# Patient Record
Sex: Male | Born: 2000 | Race: Black or African American | Hispanic: No | Marital: Single | Smoking: Former smoker
Health system: Southern US, Community
[De-identification: ages and names within clinical notes are randomized; demographics above are authoritative.]

## PROBLEM LIST (undated history)

## (undated) ENCOUNTER — Ambulatory Visit: Admission: EM | Source: Home / Self Care

## (undated) DIAGNOSIS — K219 Gastro-esophageal reflux disease without esophagitis: Secondary | ICD-10-CM

## (undated) DIAGNOSIS — T7840XA Allergy, unspecified, initial encounter: Secondary | ICD-10-CM

## (undated) DIAGNOSIS — D571 Sickle-cell disease without crisis: Secondary | ICD-10-CM

## (undated) DIAGNOSIS — F419 Anxiety disorder, unspecified: Secondary | ICD-10-CM

## (undated) HISTORY — PX: TONSILLECTOMY: SUR1361

## (undated) HISTORY — DX: Allergy, unspecified, initial encounter: T78.40XA

## (undated) HISTORY — DX: Gastro-esophageal reflux disease without esophagitis: K21.9

## (undated) HISTORY — DX: Anxiety disorder, unspecified: F41.9

---
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
## 2021-01-12 ENCOUNTER — Emergency Department
Admission: EM | Admit: 2021-01-12 | Discharge: 2021-01-12 | Disposition: A | Discharge status: Home / Self Care | Payer: BC Managed Care – PPO | Attending: Emergency Medicine | Admitting: Emergency Medicine

## 2021-01-12 ENCOUNTER — Other Ambulatory Visit: Payer: Self-pay

## 2021-01-12 ENCOUNTER — Emergency Department: Payer: BC Managed Care – PPO

## 2021-01-12 DIAGNOSIS — J189 Pneumonia, unspecified organism: Secondary | ICD-10-CM

## 2021-01-12 DIAGNOSIS — J181 Lobar pneumonia, unspecified organism: Secondary | ICD-10-CM | POA: Diagnosis not present

## 2021-01-12 DIAGNOSIS — R079 Chest pain, unspecified: Secondary | ICD-10-CM

## 2021-01-12 DIAGNOSIS — R0789 Other chest pain: Secondary | ICD-10-CM | POA: Diagnosis present

## 2021-01-12 HISTORY — DX: Sickle-cell disease without crisis: D57.1

## 2021-01-12 LAB — RETICULOCYTES
Immature Retic Fract: 40.1 % — ABNORMAL HIGH (ref 2.3–15.9)
RBC.: 4.41 MIL/uL (ref 4.22–5.81)
Retic Count, Absolute: 209 10*3/uL — ABNORMAL HIGH (ref 19.0–186.0)
Retic Ct Pct: 4.7 % — ABNORMAL HIGH (ref 0.4–3.1)

## 2021-01-12 LAB — CBC WITH DIFFERENTIAL/PLATELET
Abs Immature Granulocytes: 0.05 10*3/uL (ref 0.00–0.07)
Basophils Absolute: 0.1 10*3/uL (ref 0.0–0.1)
Basophils Relative: 1 %
Eosinophils Absolute: 0.2 10*3/uL (ref 0.0–0.5)
Eosinophils Relative: 1 %
HCT: 33.9 % — ABNORMAL LOW (ref 39.0–52.0)
Hemoglobin: 12.1 g/dL — ABNORMAL LOW (ref 13.0–17.0)
Immature Granulocytes: 0 %
Lymphocytes Relative: 41 %
Lymphs Abs: 6.4 10*3/uL — ABNORMAL HIGH (ref 0.7–4.0)
MCH: 27.2 pg (ref 26.0–34.0)
MCHC: 35.7 g/dL (ref 30.0–36.0)
MCV: 76.2 fL — ABNORMAL LOW (ref 80.0–100.0)
Monocytes Absolute: 1.4 10*3/uL — ABNORMAL HIGH (ref 0.1–1.0)
Monocytes Relative: 9 %
Neutro Abs: 7.5 10*3/uL (ref 1.7–7.7)
Neutrophils Relative %: 48 %
Platelets: 399 10*3/uL (ref 150–400)
RBC: 4.45 MIL/uL (ref 4.22–5.81)
RDW: 17.5 % — ABNORMAL HIGH (ref 11.5–15.5)
WBC: 15.6 10*3/uL — ABNORMAL HIGH (ref 4.0–10.5)
nRBC: 0.8 % — ABNORMAL HIGH (ref 0.0–0.2)

## 2021-01-12 LAB — TSH: TSH: 1.012 u[IU]/mL (ref 0.350–4.500)

## 2021-01-12 LAB — BASIC METABOLIC PANEL
Anion gap: 12 (ref 5–15)
BUN: 8 mg/dL (ref 6–20)
CO2: 21 mmol/L — ABNORMAL LOW (ref 22–32)
Calcium: 9.4 mg/dL (ref 8.9–10.3)
Chloride: 109 mmol/L (ref 98–111)
Creatinine, Ser: 1.17 mg/dL (ref 0.61–1.24)
GFR, Estimated: >60 mL/min (ref >60)
Glucose, Bld: 163 mg/dL — ABNORMAL HIGH (ref 70–99)
Potassium: 3.7 mmol/L (ref 3.5–5.1)
Sodium: 142 mmol/L (ref 135–145)

## 2021-01-12 LAB — LACTIC ACID, PLASMA: Lactic Acid, Venous: 1.5 mmol/L (ref 0.5–1.9)

## 2021-01-12 LAB — TROPONIN I (HIGH SENSITIVITY)
Troponin I (High Sensitivity): 11 ng/L (ref <18)
Troponin I (High Sensitivity): 11 ng/L (ref <18)

## 2021-01-12 LAB — T4, FREE: Free T4: 0.78 ng/dL (ref 0.61–1.12)

## 2021-01-12 LAB — D-DIMER, QUANTITATIVE: D-Dimer, Quant: 0.57 ug/mL-FEU — ABNORMAL HIGH (ref 0.00–0.50)

## 2021-01-12 LAB — CK: Total CK: 150 U/L (ref 49–397)

## 2021-01-12 MED ORDER — SODIUM CHLORIDE 0.9 % IV BOLUS
1000.0000 mL | Freq: Once | INTRAVENOUS | Status: AC
Start: 2021-01-12 — End: 2021-01-12
  Administered 2021-01-12: 1000 mL via INTRAVENOUS

## 2021-01-12 MED ORDER — KETOROLAC TROMETHAMINE 30 MG/ML IJ SOLN
15.0000 mg | Freq: Once | INTRAMUSCULAR | Status: AC
Start: 2021-01-12 — End: 2021-01-12
  Administered 2021-01-12: 15 mg via INTRAVENOUS
  Filled 2021-01-12: qty 1

## 2021-01-12 MED ORDER — IOHEXOL 350 MG/ML SOLN
75.0000 mL | Freq: Once | INTRAVENOUS | Status: AC | PRN
  Administered 2021-01-12: 75 mL via INTRAVENOUS

## 2021-01-12 MED ORDER — SODIUM CHLORIDE 0.9 % IV SOLN
500.0000 mg | Freq: Once | INTRAVENOUS | Status: AC
  Administered 2021-01-12: 500 mg via INTRAVENOUS
  Filled 2021-01-12: qty 500

## 2021-01-12 MED ORDER — AZITHROMYCIN 250 MG PO TABS: 250.0000 mg | ORAL_TABLET | Freq: Every day | ORAL | 0 refills | Status: DC

## 2021-01-12 MED ORDER — ALBUTEROL SULFATE (2.5 MG/3ML) 0.083% IN NEBU
2.5000 mg | INHALATION_SOLUTION | Freq: Once | RESPIRATORY_TRACT | Status: AC
  Administered 2021-01-12: 2.5 mg via RESPIRATORY_TRACT
  Filled 2021-01-12: qty 3

## 2021-01-12 MED ORDER — AMOXICILLIN-POT CLAVULANATE 875-125 MG PO TABS: 1.0000 | ORAL_TABLET | Freq: Two times a day (BID) | ORAL | 0 refills | Status: DC

## 2021-01-12 MED ORDER — ONDANSETRON HCL 4 MG/2ML IJ SOLN
4.0000 mg | Freq: Once | INTRAMUSCULAR | Status: AC
  Administered 2021-01-12: 4 mg via INTRAVENOUS
  Filled 2021-01-12: qty 2

## 2021-01-12 MED ORDER — SODIUM CHLORIDE 0.9 % IV SOLN
1.0000 g | INTRAVENOUS | Status: DC
  Administered 2021-01-12: 1 g via INTRAVENOUS
  Filled 2021-01-12: qty 10

## 2021-01-12 NOTE — ED Provider Notes (Signed)
[facility] Emergency Department Provider Note   ____________________________________________   Event Date/Time   First MD Initiated Contact with Patient 01/12/21 [phone]     (approximate)  I have reviewed the triage vital signs and the nursing notes.   HISTORY  Chief Complaint Chest Pain    HPI William Austin is a 20 y.o. male who presents to the ED from home with a chief complaint of chest tightness.  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  States he was drifting off to sleep approximately 1 hour prior to arrival when he started to have a sensation of chest tightness and tingly feeling up to his head.  Denies associated diaphoresis, shortness of breath, palpitations, nausea/vomiting.  Denies recent fever, cough, abdominal pain, dysuria or diarrhea.  Denies recent travel or trauma.  Denies COVID exposure.  Does admit to recent marijuana use.     Past Medical History:  Diagnosis Date  . Sickle cell anemia (HCC)   Acute chest syndrome 05/2011  There are no problems to display for this patient.   Past Surgical History:  Procedure Laterality Date  . TONSILLECTOMY      Prior to Admission medications   Not on File    Allergies Patient has no allergy information on record.  No family history on file.  Social History Social History   Tobacco Use  . Smoking status: Never Smoker  . Smokeless tobacco: Never Used  Substance Use Topics  . Alcohol use: Yes    Review of Systems  Constitutional: No fever/chills Eyes: No visual changes. ENT: No sore throat. Cardiovascular: Positive for chest pain. Respiratory: Denies shortness of breath. Gastrointestinal: No abdominal pain.  No nausea, no vomiting.  No diarrhea.  No constipation. Genitourinary: Negative for  dysuria. Musculoskeletal: Negative for back pain. Skin: Negative for rash. Neurological: Negative for headaches, focal weakness or numbness.   ____________________________________________   PHYSICAL EXAM:  VITAL SIGNS: ED Triage Vitals  Enc Vitals Group     BP 01/12/21 0231 (!) 179/100     Pulse Rate 01/12/21 0231 (!) 119     Resp 01/12/21 0231 20     Temp 01/12/21 0231 98.4 F (36.9 C)     Temp Source 01/12/21 0231 Oral     SpO2 01/12/21 0231 100 %     Weight 01/12/21 0229 150 lb (68 kg)     Height 01/12/21 0229 5\' 6"  (1.676 m)     Head Circumference --      Peak Flow --      Pain Score 01/12/21 0229 5     Pain Loc --      Pain Edu? --      Excl. in GC? --     Constitutional: Alert and oriented. Well appearing and in no acute distress. Eyes: Conjunctivae are normal. PERRL. EOMI. Head: Atraumatic. Nose: No congestion/rhinnorhea. Mouth/Throat: Mucous membranes are moist.   Neck: No stridor.   Cardiovascular: Normal rate, regular rhythm. Grossly normal heart sounds.  Good peripheral circulation. Respiratory: Normal respiratory effort.  No retractions. Lungs slightly diminished bibasilarly. Gastrointestinal: Soft and nontender to light or deep palpation. No distention. No abdominal bruits. No CVA tenderness. Musculoskeletal: No lower extremity tenderness nor edema.  No joint effusions. Neurologic:  Normal speech and language. No gross focal neurologic deficits are appreciated. No gait instability. Skin:  Skin is warm, dry and intact. No rash noted. Psychiatric: Mood and affect are normal. Speech and behavior are normal.  ____________________________________________   LABS (all labs ordered are listed, but only abnormal results are displayed)  Labs Reviewed  CBC WITH DIFFERENTIAL/PLATELET - Abnormal; Notable for the following components:      Result Value   WBC 15.6 (*)    Hemoglobin 12.1 (*)    HCT 33.9 (*)    MCV 76.2 (*)    RDW 17.5 (*)    nRBC 0.8 (*)     Lymphs Abs 6.4 (*)    Monocytes Absolute 1.4 (*)    All other components within normal limits  BASIC METABOLIC PANEL - Abnormal; Notable for the following components:   CO2 21 (*)    Glucose, Bld 163 (*)    All other components within normal limits  RETICULOCYTES - Abnormal; Notable for the following components:   Retic Ct Pct 4.7 (*)    Retic Count, Absolute 209.0 (*)    Immature Retic Fract 40.1 (*)    All other components within normal limits  D-DIMER, QUANTITATIVE - Abnormal; Notable for the following components:   D-Dimer, Quant 0.57 (*)    All other components within normal limits  CULTURE, BLOOD (ROUTINE X 2)  CULTURE, BLOOD (ROUTINE X 2)  TSH  T4, FREE  CK  URINE DRUG SCREEN, QUALITATIVE ([facility] ONLY)  LACTIC ACID, PLASMA  LACTIC ACID, PLASMA  PROCALCITONIN  TROPONIN I (HIGH SENSITIVITY)  TROPONIN I (HIGH SENSITIVITY)   ____________________________________________  EKG  ED ECG REPORT I, Montrey Buist J, the attending physician, personally viewed and interpreted this ECG.   Date: 01/12/2021  EKG Time: 0229  Rate: 121  Rhythm: sinus tachycardia  Axis: Normal  Intervals:none  ST&T Change: Nonspecific  ____________________________________________  RADIOLOGY I, Jehan Bonano J, personally viewed and evaluated these images (plain radiographs) as part of my medical decision making, as well as reviewing the written report by the radiologist.  ED MD interpretation: No acute cardiopulmonary process; CT chest negative for PE but concerning for multifocal pneumonia versus ACS  Official radiology report(s): DG Chest 2 View  Result Date: 01/12/2021 CLINICAL DATA:  Chest pain, tightness and tingling, history of sickle cell EXAM: CHEST - 2 VIEW COMPARISON:  Radiograph 08/13/2011 FINDINGS: No consolidation, features of edema, pneumothorax, or effusion. Pulmonary vascularity is normally distributed. The cardiomediastinal contours are unremarkable. No acute osseous or soft tissue  abnormality. IMPRESSION: No acute cardiopulmonary abnormality. Electronically Signed   By: Kreg ShropshirePrice  DeHay M.D.   On: 01/12/2021 03:11   CT Angio Chest PE W/Cm &/Or Wo Cm  Result Date: 01/12/2021 CLINICAL DATA:  20 year old male with acute onset chest tightness. Abnormal D-dimer. Sickle cell anemia. EXAM: CT ANGIOGRAPHY CHEST WITH CONTRAST TECHNIQUE: Multidetector CT imaging of the chest was performed using the standard protocol during bolus administration of intravenous contrast. Multiplanar CT image reconstructions and MIPs were obtained to evaluate the vascular anatomy. CONTRAST:  75mL OMNIPAQUE IOHEXOL 350 MG/ML SOLN COMPARISON:  Chest radiographs 0251 hours. FINDINGS: Cardiovascular: Adequate contrast bolus timing in the pulmonary arterial tree. Mild respiratory motion. No focal filling defect identified in the pulmonary arteries to suggest acute pulmonary embolism. Borderline cardiomegaly. No pericardial effusion. Normal visible aorta. No calcified coronary artery atherosclerosis is evident. Mediastinum/Nodes: Small volume residual thymus. No mediastinal or hilar lymphadenopathy. Lungs/Pleura: Somewhat low lung volumes. Major airways remain patent. Mild dependent atelectasis. Superimposed multifocal right upper lobe nodular peribronchial sub solid opacity (series 7, image 36). Superimposed 2 cm area  of peripheral consolidation in the lingula (series 7, image 54), smaller similar peripheral opacity in the posterior basal segment left lower lobe. Right middle and lower lobe appear spared, with only mild atelectasis. No pleural effusion. Upper Abdomen: Stomach is distended in the left upper quadrant. Negative visible liver and spleen. Musculoskeletal: Abnormal bone mineralization, likely the sequelae of sickle cell disease. No vertebral endplate collapse. No acute osseous abnormality identified. Review of the MIP images confirms the above findings. IMPRESSION: 1. Negative for acute pulmonary embolus. 2.  Appearance suspicious for acute chest syndrome versus multifocal infection: Right upper lobe bronchopneumonia and small peripheral areas of lung consolidation in the lingula and left lower lobe. Superimposed pulmonary atelectasis. No pleural effusion. 3. Borderline cardiomegaly. Abnormal bone mineralization, likely related to sickle cell disease. Electronically Signed   By: Odessa Fleming M.D.   On: 01/12/2021 06:25    ____________________________________________   PROCEDURES  Procedure(s) performed (including Critical Care):  .1-3 Lead EKG Interpretation Performed by: Irean Hong, MD Authorized by: Irean Hong, MD     Interpretation: abnormal     ECG rate:  115   ECG rate assessment: tachycardic     Rhythm: sinus tachycardia     Ectopy: none     Conduction: normal   Comments:     Patient placed on cardiac monitor to evaluate for arrhythmias     ____________________________________________   INITIAL IMPRESSION / ASSESSMENT AND PLAN / ED COURSE  As part of my medical decision making, I reviewed the following data within the electronic MEDICAL RECORD NUMBER Nursing notes reviewed and incorporated, Labs reviewed, EKG interpreted, Old chart reviewed, Radiograph reviewed and Notes from prior ED visits     20 year old male with sickle cell [place], history of ACS presenting with chest tightness. Differential diagnosis includes, but is not limited to, sickle cell pain crisis, acute chest syndrome, ACS, aortic dissection, pulmonary embolism, cardiac tamponade, pneumothorax, pneumonia, pericarditis, myocarditis, GI-related causes including esophagitis/gastritis, and musculoskeletal chest wall pain.    Initial troponin unremarkable.  Patient tachycardic.  Will check thyroid function, CK, repeat troponin as well as UDS.  Initiate IV fluid resuscitation, IV Toradol for pain.  Administer albuterol nebulizer and reassess.  Clinical Course as of 01/12/21 0716  Fri Jan 12, 2021  4098 Patient feeling better.   Heart rate normalized.  Updated patient and family member of mildly elevated D-dimer.  We will proceed with CTA chest to evaluate for PE. [JS]  0645 CT chest unremarkable for PE but concerning for infiltrates.  Doubt acute chest syndrome as patient is afebrile, not tachypneic nor hypoxic.  Have paged on-call hematologist for guidance.  In the interim, will obtain blood cultures, lactic acid, procalcitonin and initiate IV antibiotics for community-acquired pneumonia. [JS]  [id number] Left voicemail for on-call hematologist Dr. Orlie Dakin to call back. [JS]  0700 Care transferred to Dr. Erma Heritage at change of shift.  Awaiting callback from hematology, results of lactic acid and procalcitonin.  Have updated patient and his family member.  He currently denies chest pain or shortness of breath.  Room air saturations 99%. [JS]  1191 Spoke with Dr. Orlie Dakin who agrees with plan for discharge home on antibiotics.  I have encouraged the patient to follow-up with [facility] heme/onc clinic.  Patient will be discharged home after completion of IV antibiotics. [JS]    Clinical Course User Index [JS] Irean Hong, MD     ____________________________________________   FINAL CLINICAL IMPRESSION(S) / ED DIAGNOSES  Final diagnoses:  Nonspecific chest pain  Community acquired pneumonia of right upper lobe of lung     ED Discharge Orders    None      *Please note:  William Austin was evaluated in Emergency Department on 01/12/2021 for the symptoms described in the history of present illness. He was evaluated in the context of the global COVID-19 pandemic, which necessitated consideration that the patient might be at risk for infection with the SARS-CoV-2 virus that causes COVID-19. Institutional protocols and algorithms that pertain to the evaluation of patients at risk for COVID-19 are in a state of rapid change based on information released by regulatory bodies including the CDC and federal and state organizations. These  policies and algorithms were followed during the patient's care in the ED.  Some ED evaluations and interventions may be delayed as a result of limited staffing during and the pandemic.*   Note:  This document was prepared using Dragon voice recognition software and may include unintentional dictation errors.   Irean Hong, MD 01/12/21 [phone]

## 2021-01-12 NOTE — ED Notes (Signed)
Pt transported to CT ?

## 2021-01-12 NOTE — ED Notes (Signed)
Patient transported to X-ray 

## 2021-01-12 NOTE — Discharge Instructions (Addendum)
Take antibiotics as prescribed (Augmentin 875mg  twice daily x 7 days, Azithromycin 250mg  daily x 4 days). You may take Tylenol and/or Ibuprofen as needed for discomfort.  Return to the ER for worsening symptoms, persistent vomiting, difficulty breathing or other concerns

## 2021-01-12 NOTE — ED Triage Notes (Addendum)
Pt states starting aprox 1 hour ago he started to have a tightness and tingling feeling to his chest and up to his head. Pt has sickle cell. Pt denies any sob.

## 2021-01-12 NOTE — ED Provider Notes (Signed)
Assumed care from Dr. Dolores Frame at 7 AM. Briefly, the patient is a 20 y.o. male with PMHx of  has a past medical history of Sickle cell anemia (HCC). here with chest pain. Patient awaiting infusion of Rocephin, d/c afterwards. Briefly, pt has HbSC SCA and is here with transient, mild CP. CT shows ? Infiltrates though no fever, cough, SOB, hypoxia, or other signs to suggest acute chest. Case was discussed with HemeOnc by Dr. Dolores Frame. Plan to d/c with oral ABX after receiving meds in ED. LA pending.   Labs Reviewed  CBC WITH DIFFERENTIAL/PLATELET - Abnormal; Notable for the following components:      Result Value   WBC 15.6 (*)    Hemoglobin 12.1 (*)    HCT 33.9 (*)    MCV 76.2 (*)    RDW 17.5 (*)    nRBC 0.8 (*)    Lymphs Abs 6.4 (*)    Monocytes Absolute 1.4 (*)    All other components within normal limits  BASIC METABOLIC PANEL - Abnormal; Notable for the following components:   CO2 21 (*)    Glucose, Bld 163 (*)    All other components within normal limits  RETICULOCYTES - Abnormal; Notable for the following components:   Retic Ct Pct 4.7 (*)    Retic Count, Absolute 209.0 (*)    Immature Retic Fract 40.1 (*)    All other components within normal limits  D-DIMER, QUANTITATIVE - Abnormal; Notable for the following components:   D-Dimer, Quant 0.57 (*)    All other components within normal limits  CULTURE, BLOOD (ROUTINE X 2)  CULTURE, BLOOD (ROUTINE X 2)  TSH  T4, FREE  CK  URINE DRUG SCREEN, QUALITATIVE ([facility] ONLY)  LACTIC ACID, PLASMA  LACTIC ACID, PLASMA  PROCALCITONIN  TROPONIN I (HIGH SENSITIVITY)  TROPONIN I (HIGH SENSITIVITY)    Course of Care: -LA normal. D/c with outpt ABX per Dr. Dolores Frame. Return precautions given. He remains well appearing w/o tachypnea, hypoxia, or ongoing CP/SOB.     Shaune Pollack, MD 01/12/21 [phone]

## 2021-01-17 LAB — CULTURE, BLOOD (ROUTINE X 2)
Culture: NO GROWTH
Culture: NO GROWTH
Special Requests: ADEQUATE
Special Requests: ADEQUATE

## 2021-11-09 ENCOUNTER — Telehealth: Payer: Self-pay

## 2021-11-09 NOTE — Telephone Encounter (Signed)
Appt scheduled

## 2021-11-09 NOTE — Telephone Encounter (Signed)
Copied from CRM #[id number]. Topic: General - Other >> Nov 09, 2021 10:05 AM Jaquita Rector A wrote: Reason for CRM: Patient has [employer] plan and would like a call back for a new patient visit please can be reached at Ph# [phone]

## 2021-11-11 ENCOUNTER — Emergency Department: Payer: Self-pay

## 2021-11-11 ENCOUNTER — Other Ambulatory Visit: Payer: Self-pay

## 2021-11-11 ENCOUNTER — Emergency Department
Admission: EM | Admit: 2021-11-11 | Discharge: 2021-11-11 | Disposition: A | Discharge status: Home / Self Care | Payer: Self-pay | Attending: Emergency Medicine | Admitting: Emergency Medicine

## 2021-11-11 DIAGNOSIS — R079 Chest pain, unspecified: Secondary | ICD-10-CM

## 2021-11-11 DIAGNOSIS — F419 Anxiety disorder, unspecified: Secondary | ICD-10-CM | POA: Insufficient documentation

## 2021-11-11 DIAGNOSIS — R0789 Other chest pain: Secondary | ICD-10-CM | POA: Insufficient documentation

## 2021-11-11 LAB — COMPREHENSIVE METABOLIC PANEL
ALT: 59 U/L — ABNORMAL HIGH (ref 0–44)
AST: 51 U/L — ABNORMAL HIGH (ref 15–41)
Albumin: 5 g/dL (ref 3.5–5.0)
Alkaline Phosphatase: 70 U/L (ref 38–126)
Anion gap: 13 (ref 5–15)
BUN: 8 mg/dL (ref 6–20)
CO2: 22 mmol/L (ref 22–32)
Calcium: 11.4 mg/dL — ABNORMAL HIGH (ref 8.9–10.3)
Chloride: 104 mmol/L (ref 98–111)
Creatinine, Ser: 1.2 mg/dL (ref 0.61–1.24)
GFR, Estimated: >60 mL/min (ref >60)
Glucose, Bld: 104 mg/dL — ABNORMAL HIGH (ref 70–99)
Potassium: 3.9 mmol/L (ref 3.5–5.1)
Sodium: 139 mmol/L (ref 135–145)
Total Bilirubin: 1.8 mg/dL — ABNORMAL HIGH (ref 0.3–1.2)
Total Protein: 8.5 g/dL — ABNORMAL HIGH (ref 6.5–8.1)

## 2021-11-11 LAB — CBC
HCT: 39.6 % (ref 39.0–52.0)
Hemoglobin: 14 g/dL (ref 13.0–17.0)
MCH: 26 pg (ref 26.0–34.0)
MCHC: 35.4 g/dL (ref 30.0–36.0)
MCV: 73.5 fL — ABNORMAL LOW (ref 80.0–100.0)
Platelets: 499 10*3/uL — ABNORMAL HIGH (ref 150–400)
RBC: 5.39 MIL/uL (ref 4.22–5.81)
RDW: 18.8 % — ABNORMAL HIGH (ref 11.5–15.5)
WBC: 13 10*3/uL — ABNORMAL HIGH (ref 4.0–10.5)
nRBC: 0.3 % — ABNORMAL HIGH (ref 0.0–0.2)

## 2021-11-11 LAB — TROPONIN I (HIGH SENSITIVITY)
Troponin I (High Sensitivity): 12 ng/L (ref <18)
Troponin I (High Sensitivity): 10 ng/L (ref <18)

## 2021-11-11 LAB — D-DIMER, QUANTITATIVE: D-Dimer, Quant: 1.59 ug/mL-FEU — ABNORMAL HIGH (ref 0.00–0.50)

## 2021-11-11 LAB — LIPASE, BLOOD: Lipase: 29 U/L (ref 11–51)

## 2021-11-11 MED ORDER — LORAZEPAM 2 MG/ML IJ SOLN
1.0000 mg | Freq: Once | INTRAMUSCULAR | Status: AC
  Administered 2021-11-11: 1 mg via INTRAVENOUS
  Filled 2021-11-11: qty 1

## 2021-11-11 MED ORDER — IOHEXOL 350 MG/ML SOLN
75.0000 mL | Freq: Once | INTRAVENOUS | Status: AC | PRN
Start: 2021-11-11 — End: 2021-11-11
  Administered 2021-11-11: 75 mL via INTRAVENOUS

## 2021-11-11 MED ORDER — PANTOPRAZOLE SODIUM 40 MG PO TBEC: 40.0000 mg | DELAYED_RELEASE_TABLET | Freq: Every day | ORAL | 1 refills | Status: DC

## 2021-11-11 MED ORDER — ALUM & MAG HYDROXIDE-SIMETH 200-200-20 MG/5ML PO SUSP
30.0000 mL | Freq: Once | ORAL | Status: AC
  Administered 2021-11-11: 30 mL via ORAL
  Filled 2021-11-11: qty 30

## 2021-11-11 MED ORDER — LIDOCAINE VISCOUS HCL 2 % MT SOLN
15.0000 mL | Freq: Once | OROMUCOSAL | Status: AC
  Administered 2021-11-11: 15 mL via ORAL
  Filled 2021-11-11: qty 15

## 2021-11-11 NOTE — ED Triage Notes (Signed)
Pt to ED for indigestion for past couple days. Reports has taken tums. Worsening after eat. Also reports anxiety, states unsure if this is worsening.  Nad noted.  Denies n/v/shob.  Recent ETOH use

## 2021-11-11 NOTE — ED Provider Notes (Signed)
[facility] Provider Note    Event Date/Time   First MD Initiated Contact with Patient 11/11/21 1500     (approximate)  History   Chief Complaint: Indigestion  HPI  William Austin is a 21 y.o. male with a past medical history of sickle cell disease presents emergency department for chest discomfort.  According to the patient over the past week or so he has been experiencing discomfort in the center of his chest which he describes as an indigestion feeling.  Patient states he feels like he needs to belch but is not able to do so.  Patient also admits to significant anxiety.  Denies any shortness of breath cough or fever.  Patient states he did drink alcohol on Friday but does not normally drink alcohol and is not a daily drinker.  Physical Exam   Triage Vital Signs: ED Triage Vitals [11/11/21 1453]  Enc Vitals Group     BP (!) 176/131     Pulse Rate (!) 120     Resp 20     Temp 98.4 F (36.9 C)     Temp src      SpO2 99 %     Weight 150 lb (68 kg)     Height 5\' 6"  (1.676 m)     Head Circumference      Peak Flow      Pain Score 0     Pain Loc      Pain Edu?      Excl. in [place]?     Most recent vital signs: Vitals:   11/11/21 1453 11/11/21 1458  BP: (!) 176/131   Pulse: (!) 120 (!) 134  Resp: 20   Temp: 98.4 F (36.9 C)   SpO2: 99%     General: Awake, patient does appear anxious. CV:  Good peripheral perfusion.  Regular rhythm rate around 120 bpm. Resp:  Normal effort.  Equal breath sounds bilaterally.  No wheeze rales or rhonchi. Abd:  No distention.  Soft, nontender.  No rebound or guarding.    ED Results / Procedures / Treatments   EKG  EKG viewed and interpreted by myself shows sinus tachycardia at 136 bpm with a narrow QRS, normal axis, normal intervals.  Patient has nonspecific ST changes with peaked appearing T waves.  RADIOLOGY  I personally reviewed the CT images, no acute abnormality seen on my evaluation. Radiology is read  the CTA is negative.   MEDICATIONS ORDERED IN ED: Medications  LORazepam (ATIVAN) injection 1 mg (has no administration in time range)  alum & mag hydroxide-simeth (MAALOX/MYLANTA) 200-200-20 MG/5ML suspension 30 mL (has no administration in time range)    And  lidocaine (XYLOCAINE) 2 % viscous mouth solution 15 mL (has no administration in time range)     IMPRESSION / MDM / ASSESSMENT AND PLAN / ED COURSE  I reviewed the triage vital signs and the nursing notes.  Patient presents to the emergency department for chest discomfort for the past 1 week described as a indigestion type feeling like he needs to belch.  Patient denies any shortness of breath nausea or diaphoresis.  Patient is quite anxious in appearance and admits a history of anxiety.  Patient has a history of sickle cell disease, differential would include ACS, acute chest/pneumonia, metabolic or electrolyte abnormality given the elevated T waves concern for possible hyperkalemia.  We will check labs including chemistry, cardiac enzymes.  We will treat the patient's anxiety with Ativan and indigestion feeling  with a GI cocktail and reassess.  We will continue to closely monitor while awaiting results.  Patient noted to be quite hypertensive as well as tachycardic but again appears quite anxious as well.  Patients lab work has resulted showing a negative troponin x2.  Reassuring CBC with a reassuring hemoglobin, reassuring chemistry.  Patient's D-dimer however has resulted elevated at 1.59.  CTA of the chest I have reviewed and I do not see any evidence of PE.  Radiology is read the CT is negative for acute abnormality.  Patient states he is feeling better, pulse rate varies between 90 and 115 bpm.  Given the patient's reassuring work-up I believe the patient is safe for discharge home.  Given his intermittent chest discomfort he is experiencing which he describes more as a gas pressure we will prescribe Protonix have the patient follow-up  with his doctor.  I discussed very strict return precautions.  Patient agreeable to plan of care.  Reassuring vitals currently.  Blood pressure 136/99, afebrile.  FINAL CLINICAL IMPRESSION(S) / ED DIAGNOSES   Chest pain  Rx / DC Orders   Protonix  Note:  This document was prepared using Dragon voice recognition software and may include unintentional dictation errors.   Harvest Dark, MD 11/11/21 [phone]

## 2022-01-24 ENCOUNTER — Other Ambulatory Visit: Payer: Self-pay

## 2022-01-24 ENCOUNTER — Encounter: Payer: Self-pay | Admitting: Nurse Practitioner

## 2022-01-24 ENCOUNTER — Ambulatory Visit (INDEPENDENT_AMBULATORY_CARE_PROVIDER_SITE_OTHER): Payer: Self-pay | Admitting: Nurse Practitioner

## 2022-01-24 VITALS — BP 120/72 | HR 100 | Temp 98.8°F | Wt 158.0 lb

## 2022-01-24 DIAGNOSIS — K219 Gastro-esophageal reflux disease without esophagitis: Secondary | ICD-10-CM

## 2022-01-24 DIAGNOSIS — R748 Abnormal levels of other serum enzymes: Secondary | ICD-10-CM

## 2022-01-24 DIAGNOSIS — K4090 Unilateral inguinal hernia, without obstruction or gangrene, not specified as recurrent: Secondary | ICD-10-CM

## 2022-01-24 DIAGNOSIS — Z23 Encounter for immunization: Secondary | ICD-10-CM

## 2022-01-24 DIAGNOSIS — Z09 Encounter for follow-up examination after completed treatment for conditions other than malignant neoplasm: Secondary | ICD-10-CM

## 2022-01-24 DIAGNOSIS — D571 Sickle-cell disease without crisis: Secondary | ICD-10-CM

## 2022-01-24 DIAGNOSIS — Z7689 Persons encountering health services in other specified circumstances: Secondary | ICD-10-CM

## 2022-01-24 MED ORDER — PANTOPRAZOLE SODIUM 40 MG PO TBEC: 40.0000 mg | DELAYED_RELEASE_TABLET | Freq: Every day | ORAL | 3 refills | Status: DC

## 2022-01-24 NOTE — Addendum Note (Signed)
Addended by: Della Goo F on: 01/24/2022 03:14 PM ? ? Modules accepted: Orders ? ?

## 2022-01-24 NOTE — Progress Notes (Signed)
? ?BP 120/72   Pulse 100   Temp 98.8 ?F (37.1 ?C) (Oral)   Wt 158 lb (71.7 kg)   SpO2 98%   BMI 25.50 kg/m?   ? ?Subjective:  ? ? Patient ID: William Austin, male    DOB: 01/26/2001, 21 y.o.   MRN: [id number] ? ?HPI: ?William Austin is a 21 y.o. male ? ?Chief Complaint  ?Patient presents with  ? Establish Care  ? 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 He does have sickle cell anemia. He has not had any issues with sickle cell anemia since he was a baby. He was recently seen in the emergency department for chest pain.  ? ?Sickle cell anemia:  He was being followed by a hematologist but he has since his provider retired, which was in 2017.  He denies having and recent episodes of sickle cell crisis.  Will send in referral to hematology to establish care.  ? ?GERD: He was seen in the emergency room with chest pain on 11/11/2021, work-up was negative and he was started on Protonix.  He says his only symptom of acid reflux with belching, which has improved since starting protonix.  ? ?Elevated liver enzymes: This visit at the emergency room on 11/11/2021 his liver enzymes were elevated.  His AST was 51 ALT 59.  We will recheck labs today.  He did say that this visit to the emergency room was after a weekend of drinking.  ? ?Hernia: He says he lifts weights and noticed that he has a lump on his right  groin area.  He says if he pushes on it it goes back in.  He denies any pain.  He denies any testicular pain or difficulty urinating.  Discussed sending to a surgeon for evaluation patient would like to hold off at this time.  Discussed concerning signs and symptoms to watch out for. ? ?Relevant past medical, surgical, family and social history reviewed and updated as indicated. Interim medical history since our last visit reviewed. ?Allergies and medications reviewed and updated. ? ?Review of Systems ? ?Constitutional: Negative for fever or weight change.  ?Respiratory: Negative for cough and  shortness of breath.   ?Cardiovascular: Negative for chest pain or palpitations.  ?Gastrointestinal: Negative for abdominal pain, no bowel changes.  ?Musculoskeletal: Negative for gait problem or joint swelling.  ?Skin: Negative for rash.  ?Neurological: Negative for dizziness or headache.  ?No other specific complaints in a complete review of systems (except as listed in HPI above).  ? ?   ?Objective:  ?  ?BP 120/72   Pulse 100   Temp 98.8 ?F (37.1 ?C) (Oral)   Wt 158 lb (71.7 kg)   SpO2 98%   BMI 25.50 kg/m?   ?Wt Readings from Last 3 Encounters:  ?01/24/22 158 lb (71.7 kg)  ?11/11/21 150 lb (68 kg)  ?01/12/21 150 lb (68 kg)  ?  ?Physical Exam ? ?Constitutional: Patient appears well-developed and well-nourished.  No distress.  ?HEENT: head atraumatic, normocephalic, pupils equal and reactive to light, neck supple ?Cardiovascular: Normal rate, regular rhythm and normal heart sounds.  No murmur heard. No BLE edema. ?Pulmonary/Chest: Effort normal and breath sounds normal. No respiratory distress. ?Abdominal: Soft.  There is no tenderness. ?Psychiatric: Patient has a normal mood and affect. behavior is normal. Judgment and thought content normal.  ?Results for orders placed or performed during the hospital encounter of 11/11/21  ?Lipase, blood  ?Result Value Ref Range  ?  Lipase 29 11 - 51 U/L  ?Comprehensive metabolic panel  ?Result Value Ref Range  ? Sodium 139 135 - 145 mmol/L  ? Potassium 3.9 3.5 - 5.1 mmol/L  ? Chloride 104 98 - 111 mmol/L  ? CO2 22 22 - 32 mmol/L  ? Glucose, Bld 104 (H) 70 - 99 mg/dL  ? BUN 8 6 - 20 mg/dL  ? Creatinine, Ser 1.20 0.61 - 1.24 mg/dL  ? Calcium 11.4 (H) 8.9 - 10.3 mg/dL  ? Total Protein 8.5 (H) 6.5 - 8.1 g/dL  ? Albumin 5.0 3.5 - 5.0 g/dL  ? AST 51 (H) 15 - 41 U/L  ? ALT 59 (H) 0 - 44 U/L  ? Alkaline Phosphatase 70 38 - 126 U/L  ? Total Bilirubin 1.8 (H) 0.3 - 1.2 mg/dL  ? GFR, Estimated >60 >60 mL/min  ? Anion gap 13 5 - 15  ?CBC  ?Result Value Ref Range  ? WBC 13.0 (H) 4.0 -  10.5 K/uL  ? RBC 5.39 4.22 - 5.81 MIL/uL  ? Hemoglobin 14.0 13.0 - 17.0 g/dL  ? HCT 39.6 39.0 - 52.0 %  ? MCV 73.5 (L) 80.0 - 100.0 fL  ? MCH 26.0 26.0 - 34.0 pg  ? MCHC 35.4 30.0 - 36.0 g/dL  ? RDW 18.8 (H) 11.5 - 15.5 %  ? Platelets 499 (H) 150 - 400 K/uL  ? nRBC 0.3 (H) 0.0 - 0.2 %  ?D-dimer, quantitative  ?Result Value Ref Range  ? D-Dimer, Quant 1.59 (H) 0.00 - 0.50 ug/mL-FEU  ?Troponin I (High Sensitivity)  ?Result Value Ref Range  ? Troponin I (High Sensitivity) 12 <18 ng/L  ?Troponin I (High Sensitivity)  ?Result Value Ref Range  ? Troponin I (High Sensitivity) 10 <18 ng/L  ? ?   ?Assessment & Plan:  ? ?1. 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 ?- Ambulatory referral to Hematology / Oncology ?- CBC with Differential/Platelet ? ?2. Gastroesophageal reflux disease without esophagitis ? ?- pantoprazole (PROTONIX) 40 MG tablet; Take 1 tablet (40 mg total) by mouth daily.  Dispense: 90 tablet; Refill: 3 ? ?3. Elevated liver enzymes ? ?- COMPLETE METABOLIC PANEL WITH GFR ? ?4. Need for immunization follow-up ? ?- HPV 9-valent vaccine,Recombinat ? ?5. Right groin hernia ?Monitor situation consider seeing surgeon ? ?6. Need for Tdap vaccination ? ?- Tdap vaccine greater than or equal to 7yo IM ? ?7. Encounter to establish care ?Schedule CPE ? ?Follow up plan: ?Return in about 6 months (around 07/26/2022) for cpe. ? ? ? ? ? ?

## 2022-01-25 ENCOUNTER — Ambulatory Visit: Payer: Self-pay | Admitting: Nurse Practitioner

## 2022-01-25 LAB — COMPLETE METABOLIC PANEL WITH GFR
AG Ratio: 1.7 (calc) (ref 1.0–2.5)
ALT: 22 U/L (ref 9–46)
AST: 25 U/L (ref 10–40)
Albumin: 4.7 g/dL (ref 3.6–5.1)
Alkaline phosphatase (APISO): 61 U/L (ref 36–130)
BUN: 9 mg/dL (ref 7–25)
CO2: 23 mmol/L (ref 20–32)
Calcium: 10.2 mg/dL (ref 8.6–10.3)
Chloride: 108 mmol/L (ref 98–110)
Creat: 1.08 mg/dL (ref 0.60–1.24)
Globulin: 2.7 g/dL (calc) (ref 1.9–3.7)
Glucose, Bld: 78 mg/dL (ref 65–99)
Potassium: 5.5 mmol/L — ABNORMAL HIGH (ref 3.5–5.3)
Sodium: 140 mmol/L (ref 135–146)
Total Bilirubin: 1.3 mg/dL — ABNORMAL HIGH (ref 0.2–1.2)
Total Protein: 7.4 g/dL (ref 6.1–8.1)
eGFR: 100 mL/min/{1.73_m2} (ref >=60)

## 2022-01-25 LAB — CBC WITH DIFFERENTIAL/PLATELET
Absolute Monocytes: 825 cells/uL (ref 200–950)
Basophils Absolute: 49 cells/uL (ref 0–200)
Basophils Relative: 0.5 %
Eosinophils Absolute: 29 cells/uL (ref 15–500)
Eosinophils Relative: 0.3 %
HCT: 39.8 % (ref 38.5–50.0)
Hemoglobin: 13 g/dL — ABNORMAL LOW (ref 13.2–17.1)
Lymphs Abs: 2153 cells/uL (ref 850–3900)
MCH: 26.9 pg — ABNORMAL LOW (ref 27.0–33.0)
MCHC: 32.7 g/dL (ref 32.0–36.0)
MCV: 82.2 fL (ref 80.0–100.0)
MPV: 11.4 fL (ref 7.5–12.5)
Monocytes Relative: 8.5 %
Neutro Abs: 6645 cells/uL (ref 1500–7800)
Neutrophils Relative %: 68.5 %
Platelets: 504 10*3/uL — ABNORMAL HIGH (ref 140–400)
RBC: 4.84 10*6/uL (ref 4.20–5.80)
RDW: 18.5 % — ABNORMAL HIGH (ref 11.0–15.0)
Total Lymphocyte: 22.2 %
WBC: 9.7 10*3/uL (ref 3.8–10.8)

## 2022-03-22 ENCOUNTER — Ambulatory Visit: Payer: Self-pay

## 2022-03-22 ENCOUNTER — Other Ambulatory Visit: Payer: Self-pay | Admitting: Nurse Practitioner

## 2022-03-22 DIAGNOSIS — K219 Gastro-esophageal reflux disease without esophagitis: Secondary | ICD-10-CM

## 2022-03-22 MED ORDER — OMEPRAZOLE 20 MG PO CPDR: 20.0000 mg | DELAYED_RELEASE_CAPSULE | Freq: Every day | ORAL | 0 refills | Status: DC

## 2022-04-12 IMAGING — DX DG CHEST 1V PORT
1 series · 1 of 1 positions shown · non-contrast
Comparison: 01/12/2021.

CLINICAL DATA: Indigestion for a couple of days. Also complaining
of anxiety.

EXAM:
PORTABLE CHEST 1 VIEW

[chest ap]
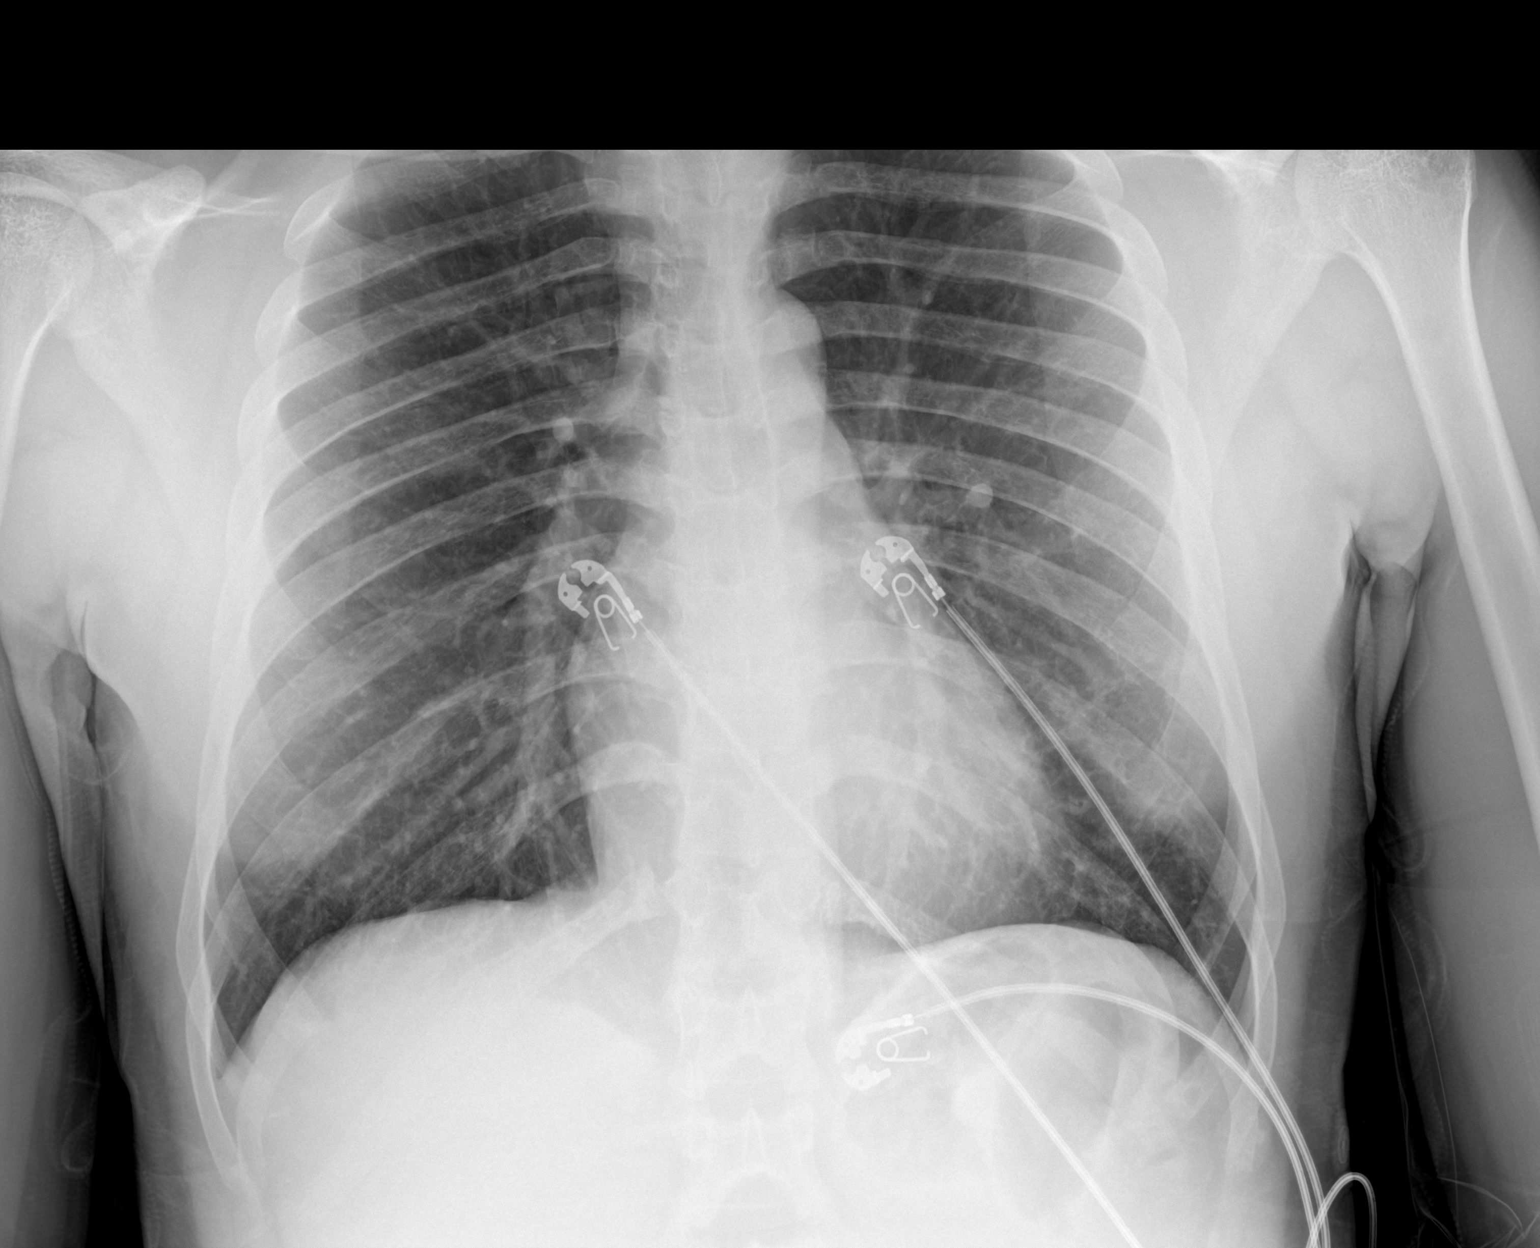

[1 of 1 positions shown; findings below may reference images not displayed]

FINDINGS: Cardiac silhouette is normal in size. Normal mediastinal and hilar
contours.

Clear lungs.  No pleural effusion or pneumothorax.

Skeletal structures are unremarkable.
IMPRESSION: No active disease.

## 2022-04-13 ENCOUNTER — Other Ambulatory Visit: Payer: Self-pay | Admitting: Nurse Practitioner

## 2022-04-13 DIAGNOSIS — K219 Gastro-esophageal reflux disease without esophagitis: Secondary | ICD-10-CM

## 2022-04-15 NOTE — Telephone Encounter (Signed)
Requested Prescriptions  Pending Prescriptions Disp Refills  . omeprazole (PRILOSEC) 20 MG capsule [Pharmacy Med Name: OMEPRAZOLE DR 20 MG CAPSULE] 90 capsule 1    Sig: TAKE 1 CAPSULE BY MOUTH EVERY DAY     Gastroenterology: Proton Pump Inhibitors Passed - 04/13/2022  2:32 PM      Passed - Valid encounter within last 12 months    Recent Outpatient Visits          2 months ago Sickle cell disease without crisis [facility])   [facility] Berniece Salines, FNP      Future Appointments            In 2 months Zane Herald, Rudolpho Sevin, FNP [facility]

## 2022-04-25 ENCOUNTER — Ambulatory Visit: Payer: Self-pay

## 2022-04-25 ENCOUNTER — Telehealth: Payer: Self-pay | Admitting: Emergency Medicine

## 2022-04-25 DIAGNOSIS — F4321 Adjustment disorder with depressed mood: Secondary | ICD-10-CM

## 2022-04-25 MED ORDER — HYDROXYZINE PAMOATE 25 MG PO CAPS: 25.0000 mg | ORAL_CAPSULE | Freq: Three times a day (TID) | ORAL | 0 refills | Status: DC | PRN

## 2022-04-25 NOTE — Telephone Encounter (Signed)
  Chief Complaint: Anxiety Symptoms: Tense restless,  Frequency: Ongoing - significant worsening this past week. Pertinent Negatives: Patient denies self harm Disposition: [] ED /[] Urgent Care (no appt availability in office) / [x] Appointment(In office/virtual)/ []  [facility]/ [] Home Care/ [] Refused Recommended Disposition /[] [facility]/ []  Follow-up with PCP Additional Notes: Pt's father died last week. Pt has a hx of anxiety - past week has worsened with death of father.Pt sounds sad. Pt was given hydroxyzine at [facility] when seen at [facility] for another issue.  Reason for Disposition  [1] Anxiety symptoms AND [2] has not been evaluated for this by doctor (or NP/PA)  Answer Assessment - Initial Assessment Questions 1. CONCERN: "Did anything happen that prompted you to call today?"      Anxiety - my dad died last wedns 2. ANXIETY SYMPTOMS: "Can you describe how you (your loved one; patient) have been feeling?" (e.g., tense, restless, panicky, anxious, keyed up, overwhelmed, sense of impending doom).      All  3. ONSET: "How long have you been feeling this way?" (e.g., hours, days, weeks)     Ongoing 4. SEVERITY: "How would you rate the level of anxiety?" (e.g., 0 - 10; or mild, moderate, severe).     7-8/10 5. FUNCTIONAL IMPAIRMENT: "How have these feelings affected your ability to do daily activities?" "Have you had more difficulty than usual doing your normal daily activities?" (e.g., getting better, same, worse; self-care, school, work, interactions)     yes 6. HISTORY: "Have you felt this way before?" "Have you ever been diagnosed with an anxiety problem in the past?" (e.g., generalized anxiety disorder, panic attacks, PTSD). If Yes, ask: "How was this problem treated?" (e.g., medicines, counseling, etc.)     yes 7. RISK OF HARM - SUICIDAL IDEATION: "Do you ever have thoughts of hurting or killing yourself?" If Yes, ask:  "Do you have these feelings now?" "Do you have a plan  on how you would do this?"     no 8. TREATMENT:  "What has been done so far to treat this anxiety?" (e.g., medicines, relaxation strategies). "What has helped?"     Went to UC for sinus infection, and was given Hydroxyzine 9. TREATMENT - THERAPIST: "Do you have a counselor or therapist? Name?"     no 10. POTENTIAL TRIGGERS: "Do you drink caffeinated beverages (e.g., coffee, colas, teas), and how much daily?" "Do you drink alcohol or use any drugs?" "Have you started any new medicines recently?"       Dad died 29. PATIENT SUPPORT: "Who is with you now?" "Who do you live with?" "Do you have family or friends who you can talk to?"        yes 12. OTHER SYMPTOMS: "Do you have any other symptoms?" (e.g., feeling depressed, trouble concentrating, trouble sleeping, trouble breathing, palpitations or fast heartbeat, chest pain, sweating, nausea, or diarrhea)       Trouble sleeping, HA 13. PREGNANCY: "Is there any chance you are pregnant?" "When was your last menstrual period?"       na  Protocols used: Anxiety and Panic Attack-A-AH

## 2022-04-25 NOTE — Telephone Encounter (Signed)
Patient on schedule for today.

## 2022-04-25 NOTE — Progress Notes (Signed)
Virtual Visit Consent   William Austin, you are scheduled for a virtual visit with a [facility] provider today. Just as with appointments in the office, your consent must be obtained to participate. Your consent will be active for this visit and any virtual visit you may have with one of our providers in the next 365 days. If you have a MyChart account, a copy of this consent can be sent to you electronically.  As this is a virtual visit, video technology does not allow for your provider to perform a traditional examination. This may limit your provider's ability to fully assess your condition. If your provider identifies any concerns that need to be evaluated in person or the need to arrange testing (such as labs, EKG, etc.), we will make arrangements to do so. Although advances in technology are sophisticated, we cannot ensure that it will always work on either your end or our end. If the connection with a video visit is poor, the visit may have to be switched to a telephone visit. With either a video or telephone visit, we are not always able to ensure that we have a secure connection.  By engaging in this virtual visit, you consent to the provision of healthcare and authorize for your insurance to be billed (if applicable) for the services provided during this visit. Depending on your insurance coverage, you may receive a charge related to this service.  I need to obtain your verbal consent now. Are you willing to proceed with your visit today? William Austin has provided verbal consent on 04/25/2022 for a virtual visit (video or telephone). Roxy Horseman, PA-C  Date: 04/25/2022 10:31 AM  Virtual Visit via Video Note   I, Roxy Horseman, connected with  William Austin  ([id number], May 26, 2001) on 04/25/22 at 10:30 AM EDT by a video-enabled telemedicine application and verified that I am speaking with the correct person using two identifiers.  Location: Patient: Virtual Visit Location Patient:  Home Provider: Virtual Visit Location Provider: Home Office   I discussed the limitations of evaluation and management by telemedicine and the availability of in person appointments. The patient expressed understanding and agreed to proceed.    History of Present Illness: William Austin is a 21 y.o. who identifies as a male who was assigned male at birth, and is being seen today for anxiety.  States that his dad just passed away a week ago.  He reports being depressed and anxious.  He's having a hard time sleeping.  He states that he has used hydroxyzine in the past with good relief of his anxiety.  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  Resting comfortably  at home.  Head is normocephalic, atraumatic.  No labored breathing.  Speech is clear and coherent with logical content.  Patient is alert and oriented at baseline.    Assessment and Plan: 1. Grief reaction  - Trial short course of hydroxyzine.  - In-person follow-up for worsening symptoms of depression or if experiencing any SI.    Follow Up Instructions:  I discussed the assessment and treatment plan with the patient. The patient was provided an opportunity to ask questions and all were answered. The patient agreed with the plan and demonstrated an understanding of the instructions.  A copy of instructions were sent to the patient via MyChart unless otherwise noted below.     The patient was advised to call back or seek an in-person evaluation if the symptoms  worsen or if the condition fails to improve as anticipated.  Time:  I spent 11 minutes with the patient via telehealth technology discussing the above problems/concerns.    Roxy Horseman, PA-C

## 2022-05-15 ENCOUNTER — Ambulatory Visit (INDEPENDENT_AMBULATORY_CARE_PROVIDER_SITE_OTHER): Payer: 59 | Admitting: Nurse Practitioner

## 2022-05-15 ENCOUNTER — Encounter: Payer: Self-pay | Admitting: Nurse Practitioner

## 2022-05-15 VITALS — BP 144/86 | HR 92 | Temp 98.0°F | Resp 16 | Ht 66.0 in | Wt 146.0 lb

## 2022-05-15 DIAGNOSIS — R918 Other nonspecific abnormal finding of lung field: Secondary | ICD-10-CM | POA: Diagnosis not present

## 2022-05-15 DIAGNOSIS — D571 Sickle-cell disease without crisis: Secondary | ICD-10-CM

## 2022-05-15 DIAGNOSIS — R030 Elevated blood-pressure reading, without diagnosis of hypertension: Secondary | ICD-10-CM

## 2022-05-15 DIAGNOSIS — D72829 Elevated white blood cell count, unspecified: Secondary | ICD-10-CM

## 2022-05-15 DIAGNOSIS — Z23 Encounter for immunization: Secondary | ICD-10-CM | POA: Diagnosis not present

## 2022-05-15 DIAGNOSIS — J189 Pneumonia, unspecified organism: Secondary | ICD-10-CM

## 2022-05-15 LAB — CBC WITH DIFFERENTIAL/PLATELET
Absolute Monocytes: 920 cells/uL (ref 200–950)
Basophils Absolute: 86 cells/uL (ref 0–200)
Basophils Relative: 0.8 %
Eosinophils Absolute: 96 cells/uL (ref 15–500)
Eosinophils Relative: 0.9 %
HCT: 38.5 % (ref 38.5–50.0)
Hemoglobin: 12.8 g/dL — ABNORMAL LOW (ref 13.2–17.1)
Lymphs Abs: 2943 cells/uL (ref 850–3900)
MCH: 27.5 pg (ref 27.0–33.0)
MCHC: 33.2 g/dL (ref 32.0–36.0)
MCV: 82.6 fL (ref 80.0–100.0)
MPV: 11.1 fL (ref 7.5–12.5)
Monocytes Relative: 8.6 %
Neutro Abs: 6655 cells/uL (ref 1500–7800)
Neutrophils Relative %: 62.2 %
Platelets: 452 10*3/uL — ABNORMAL HIGH (ref 140–400)
RBC: 4.66 10*6/uL (ref 4.20–5.80)
RDW: 20 % — ABNORMAL HIGH (ref 11.0–15.0)
Total Lymphocyte: 27.5 %
WBC: 10.7 10*3/uL (ref 3.8–10.8)

## 2022-05-15 NOTE — Progress Notes (Signed)
BP (!) 156/94   Pulse 92   Temp 98 F (36.7 C) (Oral)   Resp 16   Ht 5' 6"  (1.676 m)   Wt 146 lb (66.2 kg)   SpO2 99%   BMI 23.57 kg/m    Subjective:    Patient ID: William Austin, male    DOB: Jul 19, 2001, 21 y.o.   MRN: [id number]  HPI: William Austin is a 21 y.o. male  Chief Complaint  Patient presents with   Follow-up    ER follow up from 05/09/22.   Er follow up/pneumonia/small rounded atelectasis: He was seen on 05/09/2022 at [facility] ER.  He was diagnosed with LLL pneumonia.  He was prescribed Augmentin, he has one dose left. Patient reports he is feeling much better.  Denies any shortness of breath or fever.  He is white blood count was 16.1 on 05/08/2022.  We will recheck labs.  Is the second time the patient has had pneumonia.  With his history of sickle cell anemia we will place referral to pulmonology.  His CT scan shows possible rounded atelectasis or nodule.  Discussed with patient and recommend patient see pulmonology.  Referral placed.  Chest xray:Vague left-sided airspace opacity which could represent pneumonia in the correct clinical setting  Ct chest: 1.No acute pulmonary emboli.  2.Curvilinear and nodular opacity in the lingula with features suggestive of scarring/round atelectasis.  3.1.1 cm subpleural solid nodule in the left lower lobe could also represent area of scarring/round atelectasis with focal infection/inflammation considered less likely.   Elevated blood pressure: His blood in the er was 158/93.  Today his blood pressure is 156/94, retake was 144/86.  He denies any chest pain, shortness of breath, headaches or blurred vision.  Patient is going to keep a log of blood pressures and bring to his next appointment.  Discussed watching sodium intake.  Grief reaction:  Patient's father recently passed away.  He had a virtual visit with [facility] provider and was prescribed hydroxyzine.  Patient states that he has not taken it in awhile and reports it is getting  easier.  Patient is working through his grief.     05/15/2022    1:19 PM 01/24/2022    1:57 PM  Depression screen PHQ 2/9  Decreased Interest 3 0  Down, Depressed, Hopeless 3 0  PHQ - 2 Score 6 0  Altered sleeping 1   Tired, decreased energy 1   Change in appetite 2   Feeling bad or failure about yourself  0   Trouble concentrating 0   Moving slowly or fidgety/restless 0   Suicidal thoughts 0   PHQ-9 Score 10   Difficult doing work/chores Somewhat difficult     Relevant past medical, surgical, family and social history reviewed and updated as indicated. Interim medical history since our last visit reviewed. Allergies and medications reviewed and updated.  Review of Systems  Constitutional: Negative for fever or weight change.  Respiratory: Negative for cough and shortness of breath.   Cardiovascular: Negative for chest pain or palpitations.  Gastrointestinal: Negative for abdominal pain, no bowel changes.  Musculoskeletal: Negative for gait problem or joint swelling.  Skin: Negative for rash.  Neurological: Negative for dizziness or headache.  No other specific complaints in a complete review of systems (except as listed in HPI above).      Objective:    BP (!) 156/94   Pulse 92   Temp 98 F (36.7 C) (Oral)   Resp 16   Ht  5' 6"  (1.676 m)   Wt 146 lb (66.2 kg)   SpO2 99%   BMI 23.57 kg/m   Wt Readings from Last 3 Encounters:  05/15/22 146 lb (66.2 kg)  01/24/22 158 lb (71.7 kg)  11/11/21 150 lb (68 kg)    Physical Exam  Constitutional: Patient appears well-developed and well-nourished. No distress.  HEENT: head atraumatic, normocephalic, pupils equal and reactive to light, neck supple Cardiovascular: Normal rate, regular rhythm and normal heart sounds.  No murmur heard. No BLE edema. Pulmonary/Chest: Effort normal and breath sounds normal. No respiratory distress. Abdominal: Soft.  There is no tenderness. Psychiatric: Patient has a normal mood and affect.  behavior is normal. Judgment and thought content normal.  Results for orders placed or performed in visit on 01/24/22  CBC with Differential/Platelet  Result Value Ref Range   WBC 9.7 3.8 - 10.8 Thousand/uL   RBC 4.84 4.20 - 5.80 Million/uL   Hemoglobin 13.0 (L) 13.2 - 17.1 g/dL   HCT 39.8 38.5 - 50.0 %   MCV 82.2 80.0 - 100.0 fL   MCH 26.9 (L) 27.0 - 33.0 pg   MCHC 32.7 32.0 - 36.0 g/dL   RDW 18.5 (H) 11.0 - 15.0 %   Platelets 504 (H) 140 - 400 Thousand/uL   MPV 11.4 7.5 - 12.5 fL   Neutro Abs 6,645 1,500 - 7,800 cells/uL   Lymphs Abs 2,153 850 - 3,900 cells/uL   Absolute Monocytes 825 200 - 950 cells/uL   Eosinophils Absolute 29 15 - 500 cells/uL   Basophils Absolute 49 0 - 200 cells/uL   Neutrophils Relative % 68.5 %   Total Lymphocyte 22.2 %   Monocytes Relative 8.5 %   Eosinophils Relative 0.3 %   Basophils Relative 0.5 %  COMPLETE METABOLIC PANEL WITH GFR  Result Value Ref Range   Glucose, Bld 78 65 - 99 mg/dL   BUN 9 7 - 25 mg/dL   Creat 1.08 0.60 - 1.24 mg/dL   eGFR 100 > OR = 60 mL/min/1.43m   BUN/Creatinine Ratio NOT APPLICABLE 6 - 22 (calc)   Sodium 140 135 - 146 mmol/L   Potassium 5.5 (H) 3.5 - 5.3 mmol/L   Chloride 108 98 - 110 mmol/L   CO2 23 20 - 32 mmol/L   Calcium 10.2 8.6 - 10.3 mg/dL   Total Protein 7.4 6.1 - 8.1 g/dL   Albumin 4.7 3.6 - 5.1 g/dL   Globulin 2.7 1.9 - 3.7 g/dL (calc)   AG Ratio 1.7 1.0 - 2.5 (calc)   Total Bilirubin 1.3 (H) 0.2 - 1.2 mg/dL   Alkaline phosphatase (APISO) 61 36 - 130 U/L   AST 25 10 - 40 U/L   ALT 22 9 - 46 U/L      Assessment & Plan:   Problem List Items Addressed This Visit   None Visit Diagnoses     Community acquired pneumonia of left lower lobe of lung    -  Primary   Patient reports he is feeling much better.  He has 1 dose of antibiotic left to take and he will take that tonight.   Abnormal CT scan, lung       Referral for pulmonology.   Relevant Orders   Ambulatory referral to Pulmonology   Sickle  cell disease without crisis ([facility]       Patient increased risk for this we will update immunizations   Encounter for vaccination       Relevant Orders   HPV  9-valent vaccine,Recombinat (Completed)   Pneumococcal polysaccharide vaccine 23-valent greater than or equal to 2yo subcutaneous/IM   Leukocytosis, unspecified type       We will repeat labs.   Relevant Orders   CBC with Differential/Platelet   Elevated blood pressure reading       Discussed with patient keeping blood pressure log and bring to next appointment.  Also discussed decreasing sodium in diet.        Follow up plan: Return for Has an appointment already scheduled.Marland Kitchen

## 2022-05-21 ENCOUNTER — Ambulatory Visit (INDEPENDENT_AMBULATORY_CARE_PROVIDER_SITE_OTHER): Payer: 59 | Admitting: Student in an Organized Health Care Education/Training Program

## 2022-05-21 ENCOUNTER — Other Ambulatory Visit: Payer: Self-pay | Admitting: *Deleted

## 2022-05-21 ENCOUNTER — Ambulatory Visit
Admission: RE | Admit: 2022-05-21 | Discharge: 2022-05-21 | Disposition: A | Discharge status: Home / Self Care | Payer: Self-pay | Source: Ambulatory Visit | Attending: Student in an Organized Health Care Education/Training Program | Admitting: Student in an Organized Health Care Education/Training Program

## 2022-05-21 ENCOUNTER — Encounter: Payer: Self-pay | Admitting: Student in an Organized Health Care Education/Training Program

## 2022-05-21 VITALS — BP 130/72 | HR 99 | Temp 97.7°F | Ht 67.0 in | Wt 147.0 lb

## 2022-05-21 DIAGNOSIS — R911 Solitary pulmonary nodule: Secondary | ICD-10-CM | POA: Diagnosis not present

## 2022-05-21 DIAGNOSIS — D5720 Sickle-cell/Hb-C disease without crisis: Secondary | ICD-10-CM

## 2022-05-21 DIAGNOSIS — R053 Chronic cough: Secondary | ICD-10-CM

## 2022-05-21 MED ORDER — DESLORATADINE 5 MG PO TABS: 5.0000 mg | ORAL_TABLET | Freq: Every day | ORAL | 3 refills | Status: AC

## 2022-05-21 MED ORDER — SALINE SPRAY 0.65 % NA SOLN: 1.0000 | NASAL | 6 refills | Status: AC | PRN

## 2022-05-21 MED ORDER — FLUTICASONE PROPIONATE 50 MCG/ACT NA SUSP
1.0000 | Freq: Every day | NASAL | 6 refills | Status: DC
Start: 2022-05-21 — End: 2022-07-16

## 2022-05-21 NOTE — Progress Notes (Addendum)
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  I reviewed his imaging in the [facility] system (a chest CT scan from February 2023 and another from April 2022) and noted a rounded opacity in the lingula as well as a subpleural nodule in the left lower lobe.  On my review, these opacities have not changed in the 2 scans.  Review of the report from the [facility] emergency department chest CT is notable for a rounded opacity in the lingula as well as a subpleural nodule, again suggesting that they have not changed.  I am unable to review the images from [facility] though we will attempt to obtain these.  My differential for the rounded opacity in the lingula is that of rounded atelectasis, extra medullary hematopoiesis, and damage secondary to previous acute chest syndrome.  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  While the optimal study for this is an MRI or a Technetium 99 study, I do not see a reason to further investigate it given stability.  Similarly, I suspect the nodule in the left lower lobe to be a sequelae of previous acute chest syndrome episode.  -Attempt to obtain images from [facility] for our internal review  Addendum 05/21/2022 at 4:17 pm: CT chest from [facility] uploaded to our PACS through powershare. I reviewed the images and compared them to his previous CT scan from February and they look unchanged.  #Cough   Presenting today for cough which is bothersome to him.  He has no shortness of breath, no wheezing, no chest tightness.  While patients with sickle cell disease are at increased risk for asthma, he does not exhibit any symptoms suggestive of  reactive airway disease.  Furthermore, he has no shortness of breath.  He tells me that he feels his nose is always running which is likely a symptom of allergic rhinitis contributing to his cough. On exam, his nasal turbinates are inflamed and have mucus.   I will initiate management with a nonsedating antihistamine (desloratadine 5 mg daily), Ocean spray to be used twice daily, and Flonase to be used once daily.  -Ocean Spray, use 1 spray in each nostril twice daily -Flonase 50 mcg per actuation, 1 puff in each nostril daily -Desloratadine 5 mg orally once daily  #Sickle Cell Disease ([place])  William Austin does have a history of sickle cell disease and episodes of acute chest syndrome.  His genotype is that of  suggesting a milder phenotype with less ACS episodes.  He does not currently have a hematologist and I encouraged him to establish care with one for further follow-up of his sickle cell disease. He is up to date on his vaccines.  He does not have any shortness of breath nor symptoms of asthma and his lung exam is clear.  I do not see a reason to obtain PFTs or an echocardiogram looking for asthma or pulmonary hypertension at this moment.  I will see him for follow-up in 3 months and then subsequently space out our follow-up to twice yearly.  Return in about 3 months (around 08/21/2022).  I spent 60 minutes caring for this patient today, including  preparing to see the patient, obtaining and/or reviewing separately obtained history, performing a medically appropriate examination and/or evaluation, counseling and educating the patient/family/caregiver, ordering medications, tests, or procedures, and documenting clinical information in the electronic health record  Raechel Chute, MD [facility] 05/21/2022 10:01 AM    End of visit medications:  Current Outpatient Medications:    hydrOXYzine (VISTARIL) 25 MG capsule, Take 1 capsule (25 mg total) by mouth every 8 (eight)  hours as needed., Disp: 30 capsule, Rfl: 0   Multiple Vitamins-Minerals (MENS MULTIVITAMIN) TABS, Take 1 capsule by mouth daily., Disp: , Rfl:    omeprazole (PRILOSEC) 20 MG capsule, TAKE 1 CAPSULE BY MOUTH EVERY DAY, Disp: 90 capsule, Rfl: 1   Subjective:   PATIENT ID: William Austin GENDER: male DOB: 24-Sep-2001, MRN: [id number]  Chief Complaint  Patient presents with   pulmonary consult    CTA 11/11/21- c/o prod cough with clear sputum.     HPI  William Austin is a pleasant 21 year old male presenting to clinic for the chief complaint of cough as well as to follow up on recent imaging he had done at [facility].  He was recently at the ED at [facility] (05/09/2022) where he managed for pneumonia with a course of antibiotics (amoxicillin).  At that time, a CT scan of the chest had shown a lingular rounded opacity as well as a left lower lobe subpleural nodule.  Review of imaging from his record here is notable for 2 CT scans from February 2023 and April 2022 also notable from same findings.  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  He has had multiple pneumonias in the past and review of the record from his previous hematologist is notable for an acute chest syndrome in 2012.  Today, he reports that his main chief complaint is that of cough. He has no shortness of breath and denies any chest pain. As to his cough, he reports a sensation that his nostrils are full of mucus and they are always draining.  He has tried multiple over-the-counter treatments with no improvement.  He feels that his cough is sometimes productive of thick sputum.  He does not have any fevers or chills.  He does not have any shortness of breath.  He is able to workout and exercise with no problems.  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 He is a non-smoker and denies tobacco and  vaping.  Ancillary information including prior medications, full medical/surgical/family/social histories, and PFTs (when available) are listed below and have been reviewed.   Review of Systems  Constitutional:  Negative for chills, fever, malaise/fatigue and weight loss.  Respiratory:  Positive for cough. Negative for hemoptysis, sputum production, shortness of breath and wheezing.   Cardiovascular:  Negative for chest pain, palpitations and leg swelling.     Objective:   Vitals:   05/21/22 0940  BP: 130/72  Pulse: 99  Temp: 97.7 F (36.5 C)  TempSrc: Temporal  SpO2: 100%  Weight: 147 lb (66.7 kg)  Height: 5\' 7"  (1.702 m)   100% on RA BMI Readings from Last 3 Encounters:  05/21/22 23.02 kg/m  05/15/22 23.57 kg/m  01/24/22 25.50 kg/m   Wt Readings from Last 3 Encounters:  05/21/22 147 lb (66.7 kg)  05/15/22 146 lb (66.2 kg)  01/24/22 158 lb (71.7 kg)    Physical  Exam Constitutional:      Appearance: Normal appearance. He is normal weight.  HENT:     Head: Normocephalic.     Nose: Congestion (with inflamed turbinates) and rhinorrhea present.     Mouth/Throat:     Mouth: Mucous membranes are dry.  Cardiovascular:     Rate and Rhythm: Normal rate and regular rhythm.     Pulses: Normal pulses.     Heart sounds: Normal heart sounds.  Pulmonary:     Effort: Pulmonary effort is normal.     Breath sounds: Normal breath sounds.  Abdominal:     General: Abdomen is flat.     Palpations: Abdomen is soft.  Musculoskeletal:        General: Normal range of motion.     Cervical back: Normal range of motion and neck supple.  Skin:    General: Skin is warm.  Neurological:     General: No focal deficit present.     Mental Status: He is alert and oriented to person, place, and time. Mental status is at baseline.       Ancillary Information    Past Medical History:  Diagnosis Date   Allergy    Anxiety    GERD (gastroesophageal reflux disease)    Sickle cell  anemia (HCC)      Family History  Problem Relation Age of Onset   Hypertension Mother      Past Surgical History:  Procedure Laterality Date   TONSILLECTOMY      Social History   Socioeconomic History   Marital status: Single    Spouse name: Not on file   Number of children: Not on file   Years of education: Not on file   Highest education level: Not on file  Occupational History   Not on file  Tobacco Use   Smoking status: Former    Types: E-cigarettes, Cigars    Quit date: 07/2021    Years since quitting: 0.8   Smokeless tobacco: Never   Tobacco comments:    4 cigars would last 1 month-05/21/2022  Vaping Use   Vaping Use: Former  Substance and Sexual Activity   Alcohol use: Yes   Drug use: Never   Sexual activity: Not Currently  Other Topics Concern   Not on file  Social History Narrative   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 and 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   Social Determinants of Health   Financial Resource Strain: Not on file  Food Insecurity: Not on file  Transportation Needs: Not on file  Physical Activity: Not on file  Stress: Not on file  Social Connections: Not on file  Intimate Partner Violence: Not on file     No Known Allergies   CBC    Component Value Date/Time   WBC 10.7 05/15/2022 1424   RBC 4.66 05/15/2022 1424   HGB 12.8 (L) 05/15/2022 1424   HCT 38.5 05/15/2022 1424   PLT 452 (H) 05/15/2022 1424   MCV 82.6 05/15/2022 1424   MCH 27.5 05/15/2022 1424   MCHC 33.2 05/15/2022 1424   RDW 20.0 (H) 05/15/2022 1424   LYMPHSABS 2,943 05/15/2022 1424   MONOABS 1.4 (H) 01/12/2021 0235   EOSABS 96 05/15/2022 1424   BASOSABS 86 05/15/2022 1424    Pulmonary Functions Testing Results:     No data to display          Outpatient Medications Prior to Visit  Medication  Sig Dispense Refill   hydrOXYzine (VISTARIL) 25 MG capsule Take 1 capsule (25 mg total) by mouth every 8 (eight)  hours as needed. 30 capsule 0   Multiple Vitamins-Minerals (MENS MULTIVITAMIN) TABS Take 1 capsule by mouth daily.     omeprazole (PRILOSEC) 20 MG capsule TAKE 1 CAPSULE BY MOUTH EVERY DAY 90 capsule 1   No facility-administered medications prior to visit.

## 2022-05-21 NOTE — Progress Notes (Signed)
Per Dr. Aundria Rud, images need to be obtained from [facility]. Image request sent and will upload images once received.

## 2022-05-21 NOTE — Patient Instructions (Signed)
General instructions Do not use any products that contain nicotine or tobacco. These products include cigarettes, chewing tobacco, and vaping devices, such as e-cigarettes. These lower blood oxygen levels. If you need help quitting, ask your health care provider. Consider wearing a medical alert bracelet. Use an app or journal to track your symptoms, assess your level of pain and fatigue, and keep track of your medicines. Avoid the following: High altitudes. Very high or low temperatures and big changes in temperature. Activities that will lower your oxygen levels, such as mountain climbing or doing exercise that takes a lot of effort. Stay up to date on: Your treatment plan. Learn as much as you can about your condition. Health screenings. This will help prevent problems or catch them early on. Vaccines. This will help prevent infection. Wash your hands often with soap and water to help prevent infections. Wash them for at least 20 seconds each time. Keep all follow-up visits. Regular follow-up with your health care provider can help you better manage your condition. Where to find support You can find help and support through: Talking with a therapist or taking part in support groups. Sickle Cell Disease Foundation of [place]: www.sicklecelldisease.org Where to find more information Centers for Disease Control and Prevention: FootballExhibition.com.br American Society of Hematology: www.hematology.org Contact a health care provider if: Your symptoms get worse. You have new symptoms. You have a fever. Get help right away if: You have a painful erection of the penis that lasts a long time (priapism). You become short of breath or are having trouble breathing. You have pain that cannot be controlled with medicine. You have any signs of a stroke. "BE FAST" is an easy way to remember the main warning signs: B - Balance. Dizziness, sudden trouble walking, or loss of balance. E - Eyes. Trouble seeing or a  change in how you see. F - Face. Sudden weakness or loss of feeling of the face. The face or eyelid may droop on one side. A - Arms. Weakness or loss of feeling in an arm. This happens all of a sudden and most often on one side of the body. S - Speech. Sudden trouble speaking, slurred speech, or trouble understanding what people say. T - Time. Time to call emergency services. Write down what time symptoms started. You have other signs of a stroke, such as: A sudden, very bad headache with no known cause. Feeling like you may vomit (nausea). Vomiting. Seizure. These symptoms may be an emergency. Get help right away. Call 911.

## 2022-07-02 ENCOUNTER — Encounter: Payer: Self-pay | Admitting: Nurse Practitioner

## 2022-07-02 ENCOUNTER — Other Ambulatory Visit: Payer: Self-pay

## 2022-07-02 ENCOUNTER — Ambulatory Visit (INDEPENDENT_AMBULATORY_CARE_PROVIDER_SITE_OTHER): Payer: BC Managed Care – PPO | Admitting: Nurse Practitioner

## 2022-07-02 VITALS — BP 124/82 | HR 98 | Temp 98.2°F | Resp 18 | Ht 66.0 in | Wt 154.6 lb

## 2022-07-02 DIAGNOSIS — Z0000 Encounter for general adult medical examination without abnormal findings: Secondary | ICD-10-CM | POA: Diagnosis not present

## 2022-07-02 DIAGNOSIS — Z23 Encounter for immunization: Secondary | ICD-10-CM

## 2022-07-02 NOTE — Progress Notes (Signed)
Name: William Austin   MRN: [id number]    DOB: May 20, 2001   Date:07/02/2022       Progress Note  Subjective  Chief Complaint  Chief Complaint  Patient presents with   Annual Exam    HPI  Patient presents for annual CPE.  Diet: well balanced diet Exercise: cardio 4  days a week for 30 min, and exercise the other days Sleep: 6-8 hours Last dental exam: a month ago Last eye exam: April 2023  Depression: phq 9 is negative    07/02/2022    4:18 PM 05/15/2022    1:19 PM 01/24/2022    1:57 PM  Depression screen PHQ 2/9  Decreased Interest 0 3 0  Down, Depressed, Hopeless 0 3 0  PHQ - 2 Score 0 6 0  Altered sleeping 0 1   Tired, decreased energy 0 1   Change in appetite 0 2   Feeling bad or failure about yourself  0 0   Trouble concentrating 0 0   Moving slowly or fidgety/restless 0 0   Suicidal thoughts 0 0   PHQ-9 Score 0 10   Difficult doing work/chores Not difficult at all Somewhat difficult     Hypertension:  BP Readings from Last 3 Encounters:  07/02/22 124/82  05/21/22 130/72  05/15/22 (!) 144/86    Obesity: Wt Readings from Last 3 Encounters:  07/02/22 154 lb 9.6 oz (70.1 kg)  05/21/22 147 lb (66.7 kg)  05/15/22 146 lb (66.2 kg)   BMI Readings from Last 3 Encounters:  07/02/22 24.95 kg/m  05/21/22 23.02 kg/m  05/15/22 23.57 kg/m     Lipids:  No results found for: "CHOL" No results found for: "HDL" No results found for: "LDLCALC" No results found for: "TRIG" No results found for: "CHOLHDL" No results found for: "LDLDIRECT" Glucose:  Glucose, Bld  Date Value Ref Range Status  01/24/2022 78 65 - 99 mg/dL Final    Comment:    .            Fasting reference interval .   11/11/2021 104 (H) 70 - 99 mg/dL Final    Comment:    Glucose reference range applies only to samples taken after fasting for at least 8 hours.  01/12/2021 163 (H) 70 - 99 mg/dL Final    Comment:    Glucose reference range applies only to samples taken after fasting for at least  8 hours.    [place] Office Visit from 07/02/2022 in [facility]  AUDIT-C Score 1       Single STD testing and prevention (HIV/chl/gon/syphilis): 10/10/2021 Hep C: 10/10/2021  Skin cancer: Discussed monitoring for atypical lesions Colorectal cancer: no concerns, does not qualify Prostate cancer: no concerns, does not qualify No results found for: "PSA"   Lung cancer:   Low Dose CT Chest recommended if Age 44-80 years, 30 pack-year currently smoking OR have quit w/in 15years. Patient does not qualify.   AAA:  The USPSTF recommends one-time screening with ultrasonography in men ages 6 to 48 years who have ever smoked ECG:  11/12/2021  Vaccines:  HPV: up to at age 65 , ask insurance if age between 10-45  Shingrix: 77-64 yo and ask insurance if covered when patient above 69 yo Pneumonia:  educated and discussed with patient. Flu:  educated and discussed with patient.  Advanced Care Planning: A voluntary discussion about advance care planning including the explanation and discussion of advance directives.  Discussed health care proxy and  Living will, and the patient was able to identify a health care proxy as mother or siblings.  Patient does not have a living will at present time. If patient does have living will, I have requested they bring this to the clinic to be scanned in to their chart.  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    Past Surgical History:  Procedure Laterality Date   TONSILLECTOMY      Family History  Problem Relation Age of Onset   Hypertension Mother     Social History   Socioeconomic History   Marital status: Single    Spouse name: Not on file   Number of children: Not on file   Years of education: Not on file   Highest education level: Not on file  Occupational History   Not on file  Tobacco Use   Smoking status: Former    Types:  E-cigarettes, Cigars    Quit date: 07/2021    Years since quitting: 0.9   Smokeless tobacco: Never   Tobacco comments:    4 cigars would last 1 month-05/21/2022  Vaping Use   Vaping Use: Former  Substance and Sexual Activity   Alcohol use: Yes   Drug use: Never   Sexual activity: Not Currently  Other Topics Concern   Not on file  Social History Narrative   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 and 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   Social Determinants of Health   Financial Resource Strain: Low Risk  (07/02/2022)   Overall Financial Resource Strain (CARDIA)    Difficulty of Paying Living Expenses: Not hard at all  Food Insecurity: No Food Insecurity (07/02/2022)   Hunger Vital Sign    Worried About Running Out of Food in the Last Year: Never true    Ran Out of Food in the Last Year: Never true  Transportation Needs: No Transportation Needs (07/02/2022)   PRAPARE - Administrator, Civil Service (Medical): No    Lack of Transportation (Non-Medical): No  Physical Activity: Sufficiently Active (07/02/2022)   Exercise Vital Sign    Days of Exercise per Week: 4 days    Minutes of Exercise per Session: 40 min  Stress: Stress Concern Present (07/02/2022)   [employer] of Occupational Health - Occupational Stress Questionnaire    Feeling of Stress : To some extent  Social Connections: Moderately Isolated (07/02/2022)   Social Connection and Isolation Panel [NHANES]    Frequency of Communication with Friends and Family: More than three times a week    Frequency of Social Gatherings with Friends and Family: Three times a week    Attends Religious Services: 1 to 4 times per year    Active Member of Clubs or Organizations: No    Attends Banker Meetings: Never    Marital Status: Never married  Intimate Partner Violence: Not At Risk (07/02/2022)   Humiliation, Afraid, Rape, and Kick questionnaire    Fear of Current or  Ex-Partner: No    Emotionally Abused: No    Physically Abused: No    Sexually Abused: No     Current Outpatient Medications:    desloratadine (CLARINEX) 5 MG tablet, Take 1 tablet (5 mg total) by mouth daily., Disp: 90 tablet, Rfl: 3   fluticasone (FLONASE) 50 MCG/ACT nasal spray, Place 1 spray into both nostrils daily., Disp: 18.2 mL,  Rfl: 6   Multiple Vitamins-Minerals (MENS MULTIVITAMIN) TABS, Take 1 capsule by mouth daily., Disp: , Rfl:    omeprazole (PRILOSEC) 20 MG capsule, TAKE 1 CAPSULE BY MOUTH EVERY DAY, Disp: 90 capsule, Rfl: 1   sodium chloride (OCEAN) 0.65 % SOLN nasal spray, Place 1 spray into both nostrils as needed for congestion., Disp: 104 mL, Rfl: 6   hydrOXYzine (VISTARIL) 25 MG capsule, Take 1 capsule (25 mg total) by mouth every 8 (eight) hours as needed. (Patient not taking: Reported on 07/02/2022), Disp: 30 capsule, Rfl: 0  No Known Allergies   ROS  Constitutional: Negative for fever or weight change.  Respiratory: Negative for cough and shortness of breath.   Cardiovascular: Negative for chest pain or palpitations.  Gastrointestinal: Negative for abdominal pain, no bowel changes.  Musculoskeletal: Negative for gait problem or joint swelling.  Skin: Negative for rash.  Neurological: Negative for dizziness or headache.  No other specific complaints in a complete review of systems (except as listed in HPI above).    Objective  Vitals:   07/02/22 1610  BP: 124/82  Pulse: 98  Resp: 18  Temp: 98.2 F (36.8 C)  TempSrc: Oral  SpO2: 98%  Weight: 154 lb 9.6 oz (70.1 kg)  Height: 5\' 6"  (1.676 m)    Body mass index is 24.95 kg/m.  Physical Exam  Constitutional: Patient appears well-developed and well-nourished. No distress.  HENT: Head: Normocephalic and atraumatic. Ears: B TMs ok, no erythema or effusion; Nose: Nose normal. Mouth/Throat: Oropharynx is clear and moist. No oropharyngeal exudate.  Eyes: Conjunctivae and EOM are normal. Pupils are equal,  round, and reactive to light. No scleral icterus.  Neck: Normal range of motion. Neck supple. No JVD present. No thyromegaly present.  Cardiovascular: Normal rate, regular rhythm and normal heart sounds.  No murmur heard. No BLE edema. Pulmonary/Chest: Effort normal and breath sounds normal. No respiratory distress. Abdominal: Soft. Bowel sounds are normal, no distension. There is no tenderness. no masses Musculoskeletal: Normal range of motion, no joint effusions. No gross deformities Neurological: he is alert and oriented to person, place, and time. No cranial nerve deficit. Coordination, balance, strength, speech and gait are normal.  Skin: Skin is warm and dry. No rash noted. No erythema.  Psychiatric: Patient has a normal mood and affect. behavior is normal. Judgment and thought content normal.   Recent Results (from the past 2160 hour(s))  CBC with Differential/Platelet     Status: Abnormal   Collection Time: 05/15/22  2:24 PM  Result Value Ref Range   WBC 10.7 3.8 - 10.8 Thousand/uL   RBC 4.66 4.20 - 5.80 Million/uL   Hemoglobin 12.8 (L) 13.2 - 17.1 g/dL   HCT 05/17/22 69.4 - 85.4 %   MCV 82.6 80.0 - 100.0 fL   MCH 27.5 27.0 - 33.0 pg   MCHC 33.2 32.0 - 36.0 g/dL   RDW 62.7 (H) 03.5 - 00.9 %   Platelets 452 (H) 140 - 400 Thousand/uL   MPV 11.1 7.5 - 12.5 fL   Neutro Abs 6,655 1,500 - 7,800 cells/uL   Lymphs Abs 2,943 850 - 3,900 cells/uL   Absolute Monocytes 920 200 - 950 cells/uL   Eosinophils Absolute 96 15 - 500 cells/uL   Basophils Absolute 86 0 - 200 cells/uL   Neutrophils Relative % 62.2 %   Total Lymphocyte 27.5 %   Monocytes Relative 8.6 %   Eosinophils Relative 0.9 %   Basophils Relative 0.8 %     Fall  Risk:    07/02/2022    4:11 PM 05/15/2022    1:19 PM 01/24/2022    1:56 PM  Fall Risk   Falls in the past year? 0 0 0  Number falls in past yr: 0 0 0  Injury with Fall? 0 0 0  Risk for fall due to :  No Fall Risks   Follow up Falls evaluation completed Falls  prevention discussed;Education provided Falls evaluation completed      Functional Status Survey: Is the patient deaf or have difficulty hearing?: No Does the patient have difficulty seeing, even when wearing glasses/contacts?: No Does the patient have difficulty concentrating, remembering, or making decisions?: No Does the patient have difficulty walking or climbing stairs?: No Does the patient have difficulty dressing or bathing?: No Does the patient have difficulty doing errands alone such as visiting a doctor's office or shopping?: No    Assessment & Plan  1. Annual physical exam Discussed a heart healthy diet information attached to my chart Continue to work on physical activity  2. Need for influenza vaccination  - Flu Vaccine QUAD 6+ mos PF IM (Fluarix Quad PF)    -Prostate cancer screening and PSA options (with potential risks and benefits of testing vs not testing) were discussed along with recent recs/guidelines. -USPSTF grade A and B recommendations reviewed with patient; age-appropriate recommendations, preventive care, screening tests, etc discussed and encouraged; healthy living encouraged; see AVS for patient education given to patient -Discussed importance of 150 minutes of physical activity weekly, eat two servings of fish weekly, eat one serving of tree nuts ( cashews, pistachios, pecans, almonds.Marland Kitchen) every other day, eat 6 servings of fruit/vegetables daily and drink plenty of water and avoid sweet beverages.

## 2022-07-15 ENCOUNTER — Encounter: Payer: Self-pay | Admitting: Student in an Organized Health Care Education/Training Program

## 2022-07-15 NOTE — Telephone Encounter (Signed)
Dr. Dgayli, please advise. Thanks 

## 2022-07-15 NOTE — Telephone Encounter (Signed)
Spoke to patient via telephone and scheduled appt 07/16/2022 at 4:00. Nothing further needed.

## 2022-07-16 ENCOUNTER — Ambulatory Visit (INDEPENDENT_AMBULATORY_CARE_PROVIDER_SITE_OTHER): Payer: Self-pay | Admitting: Student in an Organized Health Care Education/Training Program

## 2022-07-16 ENCOUNTER — Ambulatory Visit
Admission: RE | Admit: 2022-07-16 | Discharge: 2022-07-16 | Disposition: A | Discharge status: Home / Self Care | Payer: BC Managed Care – PPO | Source: Ambulatory Visit | Attending: Student in an Organized Health Care Education/Training Program | Admitting: Student in an Organized Health Care Education/Training Program

## 2022-07-16 ENCOUNTER — Encounter: Payer: Self-pay | Admitting: Student in an Organized Health Care Education/Training Program

## 2022-07-16 VITALS — BP 138/84 | HR 83 | Temp 97.6°F | Ht 66.0 in | Wt 159.2 lb

## 2022-07-16 DIAGNOSIS — R053 Chronic cough: Secondary | ICD-10-CM | POA: Diagnosis present

## 2022-07-16 LAB — NITRIC OXIDE: Nitric Oxide: 11

## 2022-07-16 MED ORDER — FLUTICASONE PROPIONATE 50 MCG/ACT NA SUSP: 2.0000 | Freq: Every day | NASAL | 11 refills | Status: AC

## 2022-07-16 NOTE — Progress Notes (Signed)
Synopsis: Follow up for cough.  Assessment & Plan:   #Cough    Presenting today for cough which is bothersome to him. His previous cough had improved and this current presentation is akin to clearing of the throat. He has no shortness of breath, no wheezing, no chest tightness. His lungs are clear to auscultation. While patients with sickle cell disease are at increased risk for asthma, he does not exhibit any symptoms suggestive of reactive airway disease. His FENO today is low at 11 ppb. His nose remains inflamed and I will increase the dose of his Flonase to two puffs daily.   -Ocean Spray, use 2 spray in each nostril twice daily -Flonase 50 mcg per actuation, 1 puff in each nostril daily -Desloratadine 5 mg orally once daily  #Nodule on Chest CT   I reviewed his imaging in the [facility] system (a chest CT scan from February 2023 and another from April 2022) and noted a rounded opacity in the lingula as well as a subpleural nodule in the left lower lobe. Compared to a Chest CT from the [facility] PACS, the nodule remains unchanged.   My differential for the rounded opacity in the lingula is that of rounded atelectasis, extra medullary hematopoiesis, and damage secondary to previous acute chest syndrome. 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  While the optimal study for this is an MRI or a Technetium 99 study, I do not see a reason to further investigate it given stability.  Similarly, I suspect the nodule in the left lower lobe to be a sequelae of previous acute chest syndrome episode.   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  His genotype is that of [place] suggesting a milder phenotype with less ACS episodes.  He does not currently have a hematologist and I encouraged him to establish care with one for  further follow-up of his sickle cell disease. He is up to date on his vaccines.   He does not have any shortness of breath nor symptoms of asthma and his lung exam is clear.  I do not see a reason to obtain PFTs or an echocardiogram looking for asthma or pulmonary hypertension at this moment.  I will see him for follow-up in 3 months and then subsequently space out our follow-up to twice yearly.   Return in about 3 months (around 10/16/2022).  I spent 30 minutes caring for this patient today, including preparing to see the patient, obtaining and/or reviewing separately obtained history, performing a medically appropriate examination and/or evaluation, counseling and educating the patient/family/caregiver, ordering medications, tests, or procedures, and documenting clinical information in the electronic health record  Raechel Chute, MD [facility] 07/16/2022 5:47 PM    End of visit medications:  Meds ordered this encounter  Medications   fluticasone (FLONASE) 50 MCG/ACT nasal spray    Sig: Place 2 sprays into both nostrils daily.    Dispense:  18.2 mL    Refill:  11     Current Outpatient Medications:    desloratadine (CLARINEX) 5 MG tablet, Take 1 tablet (5 mg total) by mouth daily., Disp: 90 tablet, Rfl: 3   fluticasone (FLONASE) 50 MCG/ACT nasal spray, Place 2 sprays into both nostrils daily., Disp: 18.2 mL, Rfl: 11   Multiple Vitamins-Minerals (MENS MULTIVITAMIN) TABS, Take 1  capsule by mouth daily., Disp: , Rfl:    omeprazole (PRILOSEC) 20 MG capsule, TAKE 1 CAPSULE BY MOUTH EVERY DAY, Disp: 90 capsule, Rfl: 1   sodium chloride (OCEAN) 0.65 % SOLN nasal spray, Place 1 spray into both nostrils as needed for congestion., Disp: 104 mL, Rfl: 6   hydrOXYzine (VISTARIL) 25 MG capsule, Take 1 capsule (25 mg total) by mouth every 8 (eight) hours as needed. (Patient not taking: Reported on 07/02/2022), Disp: 30 capsule, Rfl: 0   Subjective:   PATIENT ID: William Austin  GENDER: male DOB: 06-08-2001, MRN: [id number]  Chief Complaint  Patient presents with   Follow-up    Chronic cough. The same as last visit. No SOB.    HPI  William Austin is a pleasant 21 year old male presenting to clinic for the chief complaint of cough. I had seen him in August for a similar chief complaint as well as for evaluation of recent imaging performed at [facility] with a pulmonary nodule.  Today, he is reporting symptoms of a cough that is akin to clearing of the throat. It happens multiple times an hour and is consistent. There are no alleviating or exacerbating factors. There is no sputum production, hemoptysis, chest pain, chest tightness, fevers, or chills. During our last visit, I initiated flonase, an anti-histamine, and saline nasal wash. He reports that his runny nose is significantly improved as compared to prior. He does have some symptoms of reflux that he is managing with omeprazole.   He was seen at the ED at [facility] (05/09/2022) where he managed for pneumonia with a course of antibiotics (amoxicillin).  At that time, a CT scan of the chest had shown a lingular rounded opacity as well as a left lower lobe subpleural nodule.  Review of imaging from his record here is notable for 2 CT scans from February 2023 and April 2022 also notable from same findings.   Patient reports that he has a history of sickle cell disease ([place] variant) and used to follow with a hematologist at [facility] Dione Housekeeper, MD.  He has had multiple pneumonias in the past and review of the record from his previous hematologist is notable for an acute chest syndrome in 2012. He has not yet established care with a new hematologist but plans on doing so.  Ancillary information including prior medications, full medical/surgical/family/social histories, and PFTs (when available) are listed below and have been reviewed.   Review of Systems  Constitutional:  Negative for chills, fever, malaise/fatigue and weight loss.   Respiratory:  Positive for cough. Negative for hemoptysis, sputum production, shortness of breath and wheezing.   Cardiovascular:  Negative for chest pain, palpitations and leg swelling.     Objective:   Vitals:   07/16/22 1604  BP: 138/84  Pulse: 83  Temp: 97.6 F (36.4 C)  SpO2: 100%  Weight: 159 lb 3.2 oz (72.2 kg)  Height: 5\' 6"  (1.676 m)   100% on RA BMI Readings from Last 3 Encounters:  07/16/22 25.70 kg/m  07/02/22 24.95 kg/m  05/21/22 23.02 kg/m   Wt Readings from Last 3 Encounters:  07/16/22 159 lb 3.2 oz (72.2 kg)  07/02/22 154 lb 9.6 oz (70.1 kg)  05/21/22 147 lb (66.7 kg)    Physical Exam Constitutional:      Appearance: Normal appearance. He is normal weight.  HENT:     Head: Normocephalic.     Nose: Congestion (with inflamed turbinates) present. No rhinorrhea.  Mouth/Throat:     Mouth: Mucous membranes are dry.  Cardiovascular:     Rate and Rhythm: Normal rate and regular rhythm.     Pulses: Normal pulses.     Heart sounds: Normal heart sounds.  Pulmonary:     Effort: Pulmonary effort is normal.     Breath sounds: Normal breath sounds.  Abdominal:     General: Abdomen is flat.     Palpations: Abdomen is soft.  Musculoskeletal:        General: Normal range of motion.     Cervical back: Normal range of motion and neck supple.  Skin:    General: Skin is warm.  Neurological:     General: No focal deficit present.     Mental Status: He is alert and oriented to person, place, and time. Mental status is at baseline.       Ancillary Information    Past Medical History:  Diagnosis Date   Allergy    Anxiety    GERD (gastroesophageal reflux disease)    Sickle cell anemia (HCC)      Family History  Problem Relation Age of Onset   Hypertension Mother      Past Surgical History:  Procedure Laterality Date   TONSILLECTOMY      Social History   Socioeconomic History   Marital status: Single    Spouse name: Not on file    Number of children: Not on file   Years of education: Not on file   Highest education level: Not on file  Occupational History   Not on file  Tobacco Use   Smoking status: Former    Types: E-cigarettes, Cigars    Quit date: 07/2021    Years since quitting: 0.9   Smokeless tobacco: Never   Tobacco comments:    4 cigars would last 1 month-05/21/2022  Vaping Use   Vaping Use: Former  Substance and Sexual Activity   Alcohol use: Yes   Drug use: Never   Sexual activity: Not Currently  Other Topics Concern   Not on file  Social History Narrative   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 and 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   Social Determinants of Health   Financial Resource Strain: Low Risk  (07/02/2022)   Overall Financial Resource Strain (CARDIA)    Difficulty of Paying Living Expenses: Not hard at all  Food Insecurity: No Food Insecurity (07/02/2022)   Hunger Vital Sign    Worried About Running Out of Food in the Last Year: Never true    Ran Out of Food in the Last Year: Never true  Transportation Needs: No Transportation Needs (07/02/2022)   PRAPARE - Administrator, Civil Service (Medical): No    Lack of Transportation (Non-Medical): No  Physical Activity: Sufficiently Active (07/02/2022)   Exercise Vital Sign    Days of Exercise per Week: 4 days    Minutes of Exercise per Session: 40 min  Stress: Stress Concern Present (07/02/2022)   [employer] of Occupational Health - Occupational Stress Questionnaire    Feeling of Stress : To some extent  Social Connections: Moderately Isolated (07/02/2022)   Social Connection and Isolation Panel [NHANES]    Frequency of Communication with Friends and Family: More than three times a week    Frequency of Social Gatherings with Friends and Family: Three times a week    Attends Religious Services: 1 to 4 times per year  Active Member of Clubs or Organizations: No    Attends  Archivist Meetings: Never    Marital Status: Never married  Intimate Partner Violence: Not At Risk (07/02/2022)   Humiliation, Afraid, Rape, and Kick questionnaire    Fear of Current or Ex-Partner: No    Emotionally Abused: No    Physically Abused: No    Sexually Abused: No     No Known Allergies   CBC    Component Value Date/Time   WBC 10.7 05/15/2022 1424   RBC 4.66 05/15/2022 1424   HGB 12.8 (L) 05/15/2022 1424   HCT 38.5 05/15/2022 1424   PLT 452 (H) 05/15/2022 1424   MCV 82.6 05/15/2022 1424   MCH 27.5 05/15/2022 1424   MCHC 33.2 05/15/2022 1424   RDW 20.0 (H) 05/15/2022 1424   LYMPHSABS 2,943 05/15/2022 1424   MONOABS 1.4 (H) 01/12/2021 0235   EOSABS 96 05/15/2022 1424   BASOSABS 86 05/15/2022 1424    Pulmonary Functions Testing Results:     No data to display          Outpatient Medications Prior to Visit  Medication Sig Dispense Refill   desloratadine (CLARINEX) 5 MG tablet Take 1 tablet (5 mg total) by mouth daily. 90 tablet 3   Multiple Vitamins-Minerals (MENS MULTIVITAMIN) TABS Take 1 capsule by mouth daily.     omeprazole (PRILOSEC) 20 MG capsule TAKE 1 CAPSULE BY MOUTH EVERY DAY 90 capsule 1   sodium chloride (OCEAN) 0.65 % SOLN nasal spray Place 1 spray into both nostrils as needed for congestion. 104 mL 6   fluticasone (FLONASE) 50 MCG/ACT nasal spray Place 1 spray into both nostrils daily. 18.2 mL 6   hydrOXYzine (VISTARIL) 25 MG capsule Take 1 capsule (25 mg total) by mouth every 8 (eight) hours as needed. (Patient not taking: Reported on 07/02/2022) 30 capsule 0   No facility-administered medications prior to visit.

## 2022-08-12 ENCOUNTER — Ambulatory Visit: Payer: 59 | Admitting: Student in an Organized Health Care Education/Training Program

## 2022-08-13 ENCOUNTER — Ambulatory Visit: Payer: 59 | Admitting: Student in an Organized Health Care Education/Training Program

## 2022-10-23 ENCOUNTER — Other Ambulatory Visit: Payer: Self-pay | Admitting: Nurse Practitioner

## 2022-10-23 DIAGNOSIS — K219 Gastro-esophageal reflux disease without esophagitis: Secondary | ICD-10-CM

## 2022-10-23 NOTE — Telephone Encounter (Signed)
Requested Prescriptions  Pending Prescriptions Disp Refills   omeprazole (PRILOSEC) 20 MG capsule [Pharmacy Med Name: OMEPRAZOLE DR 20 MG CAPSULE] 90 capsule 1    Sig: TAKE 1 CAPSULE BY MOUTH EVERY DAY     Gastroenterology: Proton Pump Inhibitors Passed - 10/23/2022  1:32 AM      Passed - Valid encounter within last 12 months    Recent Outpatient Visits           3 months ago Annual physical exam   [facility] Bo Merino, FNP   5 months ago Community acquired pneumonia of left lower lobe of lung   [facility] Serafina Royals F, FNP   9 months ago Sickle cell disease without crisis [facility])   [facility] Bo Merino, FNP       Future Appointments             In 8 months Reece Packer, Myna Hidalgo, [facility], [facility]

## 2022-12-11 ENCOUNTER — Encounter: Payer: Self-pay | Admitting: Nurse Practitioner

## 2022-12-12 NOTE — Progress Notes (Signed)
   Acute Office Visit  Subjective:     Patient ID: William Austin, male    DOB: [birth year], 22 y.o.   MRN: [id number]  Chief Complaint  Patient presents with   Palpitations    HPI Patient is in today for palpitations. Describes it as feeling like his heart is skipping beats and faster than normal. He first noticed this about 3 weeks ago. 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 Has a history of sickle cell anemia but denies chest pain or any pain. Denies shortness of breath, syncope, lightheadedness, dizziness, headaches. No recent infections, no cough or upper respiratory symptoms. Does take over the counter allergy medications but discontinued all of these. Denies use of steroids or inhalers lately. Denies any other changes in medications. He has never had anything like this before.   Review of Systems  Constitutional:  Negative for chills and fever.  Eyes:  Negative for blurred vision.  Respiratory:  Negative for cough and shortness of breath.   Cardiovascular:  Positive for palpitations. Negative for chest pain and leg swelling.  Gastrointestinal:  Negative for abdominal pain.  Neurological:  Negative for dizziness, loss of consciousness and headaches.  Psychiatric/Behavioral:  The patient is not nervous/anxious.         Objective:    BP 116/68   Pulse (!) 115   Temp 98.2 F (36.8 C)   Resp 16   Ht 5\' 6"  (1.676 m)   Wt 150 lb 6.4 oz (68.2 kg)   SpO2 99%   BMI 24.28 kg/m  BP Readings from Last 3 Encounters:  12/13/22 116/68  07/16/22 138/84  07/02/22 124/82   Wt Readings from Last 3 Encounters:  12/13/22 150 lb 6.4 oz (68.2 kg)  07/16/22 159 lb 3.2 oz (72.2 kg)  07/02/22 154 lb 9.6 oz (70.1 kg)      Physical Exam Constitutional:      Appearance: Normal appearance.  HENT:     Head: Normocephalic and atraumatic.     Mouth/Throat:     Mouth: Mucous membranes are moist.     Pharynx: Oropharynx is clear.  Cardiovascular:      Rate and Rhythm: Normal rate and regular rhythm.     Heart sounds: No murmur heard.    No gallop.  Pulmonary:     Effort: Pulmonary effort is normal.     Breath sounds: Normal breath sounds.  Musculoskeletal:     Right lower leg: No edema.     Left lower leg: No edema.  Skin:    General: Skin is warm and dry.  Neurological:     General: No focal deficit present.     Mental Status: He is alert. Mental status is at baseline.  Psychiatric:        Mood and Affect: Mood normal.        Behavior: Behavior normal.     No results found for any visits on 12/13/22.      Assessment & Plan:   1. Palpitations: EKG in the office showing sinus tachycardia. Will obtain labs today to rule out anemia and thyroid disease. If labs normal will order Zio patch for further monitoring.   - EKG 12-Lead - CBC w/Diff/Platelet - COMPLETE METABOLIC PANEL WITH GFR - TSH - Fe+TIBC+Fer  Return if symptoms worsen or fail to improve.  Teodora Medici, DO

## 2022-12-13 ENCOUNTER — Encounter: Payer: Self-pay | Admitting: Internal Medicine

## 2022-12-13 ENCOUNTER — Ambulatory Visit: Payer: BC Managed Care – PPO | Admitting: Internal Medicine

## 2022-12-13 VITALS — BP 116/68 | HR 115 | Temp 98.2°F | Resp 16 | Ht 66.0 in | Wt 150.4 lb

## 2022-12-13 DIAGNOSIS — R002 Palpitations: Secondary | ICD-10-CM

## 2022-12-14 LAB — COMPLETE METABOLIC PANEL WITH GFR
AG Ratio: 1.4 (calc) (ref 1.0–2.5)
ALT: 38 U/L (ref 9–46)
AST: 33 U/L (ref 10–40)
Albumin: 4.6 g/dL (ref 3.6–5.1)
Alkaline phosphatase (APISO): 81 U/L (ref 36–130)
BUN: 10 mg/dL (ref 7–25)
CO2: 20 mmol/L (ref 20–32)
Calcium: 10.4 mg/dL — ABNORMAL HIGH (ref 8.6–10.3)
Chloride: 108 mmol/L (ref 98–110)
Creat: 1.03 mg/dL (ref 0.60–1.24)
Globulin: 3.3 g/dL (calc) (ref 1.9–3.7)
Glucose, Bld: 94 mg/dL (ref 65–99)
Potassium: 4.9 mmol/L (ref 3.5–5.3)
Sodium: 141 mmol/L (ref 135–146)
Total Bilirubin: 1 mg/dL (ref 0.2–1.2)
Total Protein: 7.9 g/dL (ref 6.1–8.1)
eGFR: 105 mL/min/{1.73_m2} (ref >=60)

## 2022-12-14 LAB — CBC WITH DIFFERENTIAL/PLATELET
Absolute Monocytes: 931 cells/uL (ref 200–950)
Basophils Absolute: 49 cells/uL (ref 0–200)
Basophils Relative: 0.5 %
Eosinophils Absolute: 107 cells/uL (ref 15–500)
Eosinophils Relative: 1.1 %
HCT: 38 % — ABNORMAL LOW (ref 38.5–50.0)
Hemoglobin: 11.7 g/dL — ABNORMAL LOW (ref 13.2–17.1)
Lymphs Abs: 5471 cells/uL — ABNORMAL HIGH (ref 850–3900)
MCH: 22.9 pg — ABNORMAL LOW (ref 27.0–33.0)
MCHC: 30.8 g/dL — ABNORMAL LOW (ref 32.0–36.0)
MCV: 74.4 fL — ABNORMAL LOW (ref 80.0–100.0)
MPV: 11.3 fL (ref 7.5–12.5)
Monocytes Relative: 9.6 %
Neutro Abs: 3143 cells/uL (ref 1500–7800)
Neutrophils Relative %: 32.4 %
Platelets: 587 10*3/uL — ABNORMAL HIGH (ref 140–400)
RBC: 5.11 10*6/uL (ref 4.20–5.80)
RDW: 19.1 % — ABNORMAL HIGH (ref 11.0–15.0)
Total Lymphocyte: 56.4 %
WBC: 9.7 10*3/uL (ref 3.8–10.8)

## 2022-12-14 LAB — IRON,TIBC AND FERRITIN PANEL
%SAT: 11 % (calc) — ABNORMAL LOW (ref 20–48)
Ferritin: 45 ng/mL (ref 38–380)
Iron: 50 ug/dL (ref 50–195)
TIBC: 475 mcg/dL (calc) — ABNORMAL HIGH (ref 250–425)

## 2022-12-14 LAB — TSH: TSH: 1.05 mIU/L (ref 0.40–4.50)

## 2022-12-16 ENCOUNTER — Other Ambulatory Visit: Payer: Self-pay | Admitting: Internal Medicine

## 2022-12-16 DIAGNOSIS — D571 Sickle-cell disease without crisis: Secondary | ICD-10-CM

## 2022-12-16 DIAGNOSIS — R002 Palpitations: Secondary | ICD-10-CM

## 2022-12-17 ENCOUNTER — Other Ambulatory Visit: Payer: Self-pay

## 2022-12-17 DIAGNOSIS — D571 Sickle-cell disease without crisis: Secondary | ICD-10-CM

## 2022-12-25 ENCOUNTER — Ambulatory Visit: Admit: 2022-12-25 | Payer: BC Managed Care – PPO | Admitting: Podiatry

## 2022-12-25 SURGERY — BUNIONECTOMY
Anesthesia: Choice | Site: Toe | Laterality: Left

## 2023-07-08 ENCOUNTER — Encounter: Payer: 59 | Admitting: Nurse Practitioner
